# Patient Record
Sex: Male | Born: 1965
Health system: Southern US, Community
[De-identification: ages and names within clinical notes are randomized; demographics above are authoritative.]

## PROBLEM LIST (undated history)

## (undated) DIAGNOSIS — M549 Dorsalgia, unspecified: Secondary | ICD-10-CM

## (undated) DIAGNOSIS — W3400XA Accidental discharge from unspecified firearms or gun, initial encounter: Secondary | ICD-10-CM

## (undated) DIAGNOSIS — H5440 Blindness, one eye, unspecified eye: Secondary | ICD-10-CM

## (undated) DIAGNOSIS — E785 Hyperlipidemia, unspecified: Secondary | ICD-10-CM

## (undated) DIAGNOSIS — I10 Essential (primary) hypertension: Secondary | ICD-10-CM

## (undated) HISTORY — PX: EYE SURGERY: SHX253

---

## 2001-12-31 ENCOUNTER — Encounter: Payer: Self-pay | Admitting: Emergency Medicine

## 2001-12-31 ENCOUNTER — Emergency Department (HOSPITAL_COMMUNITY): Admission: EM | Admit: 2001-12-31 | Discharge: 2001-12-31 | Payer: Self-pay | Admitting: Emergency Medicine

## 2013-05-22 ENCOUNTER — Emergency Department (HOSPITAL_COMMUNITY)
Admission: EM | Admit: 2013-05-22 | Discharge: 2013-05-22 | Disposition: A | Discharge status: Home / Self Care | Payer: Self-pay | Attending: Emergency Medicine | Admitting: Emergency Medicine

## 2013-05-22 ENCOUNTER — Encounter (HOSPITAL_COMMUNITY): Payer: Self-pay | Admitting: *Deleted

## 2013-05-22 DIAGNOSIS — E785 Hyperlipidemia, unspecified: Secondary | ICD-10-CM | POA: Insufficient documentation

## 2013-05-22 DIAGNOSIS — M545 Low back pain, unspecified: Secondary | ICD-10-CM | POA: Insufficient documentation

## 2013-05-22 DIAGNOSIS — Z79899 Other long term (current) drug therapy: Secondary | ICD-10-CM | POA: Insufficient documentation

## 2013-05-22 DIAGNOSIS — G8929 Other chronic pain: Secondary | ICD-10-CM | POA: Insufficient documentation

## 2013-05-22 DIAGNOSIS — M25559 Pain in unspecified hip: Secondary | ICD-10-CM | POA: Insufficient documentation

## 2013-05-22 DIAGNOSIS — M62838 Other muscle spasm: Secondary | ICD-10-CM | POA: Insufficient documentation

## 2013-05-22 DIAGNOSIS — I10 Essential (primary) hypertension: Secondary | ICD-10-CM | POA: Insufficient documentation

## 2013-05-22 DIAGNOSIS — F17200 Nicotine dependence, unspecified, uncomplicated: Secondary | ICD-10-CM | POA: Insufficient documentation

## 2013-05-22 HISTORY — DX: Essential (primary) hypertension: I10

## 2013-05-22 HISTORY — DX: Dorsalgia, unspecified: M54.9

## 2013-05-22 HISTORY — DX: Hyperlipidemia, unspecified: E78.5

## 2013-05-22 MED ORDER — CYCLOBENZAPRINE HCL 5 MG PO TABS: 5.0000 mg | ORAL_TABLET | Freq: Three times a day (TID) | ORAL | Status: DC | PRN

## 2013-05-22 MED ORDER — TRAMADOL HCL 50 MG PO TABS: 50.0000 mg | ORAL_TABLET | Freq: Four times a day (QID) | ORAL | Status: DC | PRN

## 2013-05-22 MED ORDER — CYCLOBENZAPRINE HCL 10 MG PO TABS
5.0000 mg | ORAL_TABLET | Freq: Once | ORAL | Status: AC
  Administered 2013-05-22: 5 mg via ORAL
  Filled 2013-05-22: qty 1

## 2013-05-22 MED ORDER — KETOROLAC TROMETHAMINE 30 MG/ML IJ SOLN
30.0000 mg | Freq: Once | INTRAMUSCULAR | Status: AC
  Administered 2013-05-22: 30 mg via INTRAMUSCULAR
  Filled 2013-05-22: qty 1

## 2013-05-22 NOTE — ED Notes (Signed)
Pt states he has had hip and back pain for years and it is hurting today more, no known injury

## 2013-05-22 NOTE — ED Provider Notes (Signed)
Medical screening examination/treatment/procedure(s) were performed by non-physician practitioner and as supervising physician I was immediately available for consultation/collaboration.  Wymon Swaney, MD 05/22/13 0844 
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
## 2013-06-03 ENCOUNTER — Ambulatory Visit: Payer: Self-pay | Attending: Family Medicine | Admitting: Internal Medicine

## 2013-06-03 ENCOUNTER — Encounter: Payer: Self-pay | Admitting: Internal Medicine

## 2013-06-03 VITALS — BP 147/90 | HR 92 | Temp 97.9°F | Resp 17 | Wt 221.6 lb

## 2013-06-03 DIAGNOSIS — M545 Low back pain, unspecified: Secondary | ICD-10-CM

## 2013-06-03 DIAGNOSIS — I10 Essential (primary) hypertension: Secondary | ICD-10-CM

## 2013-06-03 DIAGNOSIS — F17200 Nicotine dependence, unspecified, uncomplicated: Secondary | ICD-10-CM

## 2013-06-03 LAB — CBC WITH DIFFERENTIAL/PLATELET
Basophils Absolute: 0 10*3/uL (ref 0.0–0.1)
Eosinophils Relative: 2 % (ref 0–5)
HCT: 44.8 % (ref 39.0–52.0)
Hemoglobin: 15.3 g/dL (ref 13.0–17.0)
Lymphocytes Relative: 32 % (ref 12–46)
Lymphs Abs: 2.6 10*3/uL (ref 0.7–4.0)
MCV: 91.4 fL (ref 78.0–100.0)
Monocytes Absolute: 0.8 10*3/uL (ref 0.1–1.0)
Neutro Abs: 4.4 10*3/uL (ref 1.7–7.7)
RBC: 4.9 MIL/uL (ref 4.22–5.81)
RDW: 12.7 % (ref 11.5–15.5)
WBC: 8 10*3/uL (ref 4.0–10.5)

## 2013-06-03 LAB — COMPLETE METABOLIC PANEL WITH GFR
AST: 32 U/L (ref 0–37)
Alkaline Phosphatase: 66 U/L (ref 39–117)
BUN: 17 mg/dL (ref 6–23)
Creat: 1.05 mg/dL (ref 0.50–1.35)
GFR, Est Non African American: 84 mL/min
Glucose, Bld: 94 mg/dL (ref 70–99)
Potassium: 4.1 mEq/L (ref 3.5–5.3)
Total Bilirubin: 1 mg/dL (ref 0.3–1.2)

## 2013-06-03 LAB — LIPID PANEL
HDL: 40 mg/dL (ref >39)
LDL Cholesterol: 147 mg/dL — ABNORMAL HIGH (ref 0–99)
Total CHOL/HDL Ratio: 5.3 Ratio
Triglycerides: 118 mg/dL (ref <150)

## 2013-06-03 MED ORDER — CYCLOBENZAPRINE HCL 5 MG PO TABS: 5.0000 mg | ORAL_TABLET | Freq: Three times a day (TID) | ORAL | Status: DC | PRN

## 2013-06-03 MED ORDER — TRAMADOL HCL 50 MG PO TABS: 50.0000 mg | ORAL_TABLET | Freq: Four times a day (QID) | ORAL | Status: DC | PRN

## 2013-06-03 MED ORDER — ENALAPRIL MALEATE 2.5 MG PO TABS: 2.5000 mg | ORAL_TABLET | Freq: Every morning | ORAL | Status: DC

## 2013-06-03 MED ORDER — LOVASTATIN 20 MG PO TABS: 20.0000 mg | ORAL_TABLET | Freq: Every morning | ORAL | Status: DC

## 2013-06-03 MED ORDER — HYDROCHLOROTHIAZIDE 25 MG PO TABS: 25.0000 mg | ORAL_TABLET | Freq: Every morning | ORAL | Status: DC

## 2013-06-03 NOTE — Progress Notes (Signed)
Patient is here to establish care Has pain in his back and right hip Recently seen in the ED

## 2013-06-03 NOTE — Progress Notes (Signed)
Patient ID: Gregory Gay, male   DOB: 1966/04/09, 47 y.o.   MRN: [id number]  CC: To establish care  HPI: Patient was in the clinic today to establish medical care. He is a 47 years old African American, recently seen in the ED for back pain and groin pain on the right. He describes it as more like spasm. No injury. Patient is known to have hypertension and hyperlipidemia, has been on medications as listed below. He does not have diabetes. He smokes cigarettes about half a pack per day, and he drinks alcohol but occasionally. He claims to be physically active in the past but now because of pain he is no more active. Denies chest, no headache, no abdominal pain,   No Known Allergies Past Medical History  Diagnosis Date  . Back pain   . Hypertension   . Hyperlipidemia    No current outpatient prescriptions on file prior to visit.   No current facility-administered medications on file prior to visit.   No family history on file. History   Social History  . Marital Status: Single    Spouse Name: N/A    Number of Children: N/A  . Years of Education: N/A   Occupational History  . Not on file.   Social History Main Topics  . Smoking status: Heavy Tobacco Smoker -- 0.50 packs/day  . Smokeless tobacco: Not on file  . Alcohol Use: No  . Drug Use: No  . Sexual Activity: Not on file   Other Topics Concern  . Not on file   Social History Narrative  . No narrative on file    Review of Systems: Constitutional: Negative for fever, chills, diaphoresis, activity change, appetite change and fatigue. HENT: Negative for ear pain, nosebleeds, congestion, facial swelling, rhinorrhea, neck pain, neck stiffness and ear discharge.  Eyes: Negative for pain, discharge, redness, itching and visual disturbance. Respiratory: Negative for cough, choking, chest tightness, shortness of breath, wheezing and stridor.  Cardiovascular: Negative for chest pain, palpitations and leg  swelling. Gastrointestinal: Negative for abdominal distention. Genitourinary: Negative for dysuria, urgency, frequency, hematuria, flank pain, decreased urine volume, difficulty urinating and dyspareunia.  Musculoskeletal: ++ back pain, joint swelling, arthralgias and gait problem. Neurological: Negative for dizziness, tremors, seizures, syncope, facial asymmetry, speech difficulty, weakness, light-headedness, numbness and headaches.  Hematological: Negative for adenopathy. Does not bruise/bleed easily. Psychiatric/Behavioral: Negative for hallucinations, behavioral problems, confusion, dysphoric mood, decreased concentration and agitation.    Objective:   Filed Vitals:   06/03/13 1458  BP: 147/90  Pulse: 92  Temp: 97.9 F (36.6 C)  Resp: 17    Physical Exam: Constitutional: Patient appears well-developed and well-nourished. No distress. obese HENT: Normocephalic, atraumatic, External right and left ear normal. Oropharynx is clear and moist.  Eyes: Conjunctivae and EOM are normal. PERRLA, no scleral icterus. Neck: Normal ROM. Neck supple. No JVD. No tracheal deviation. No thyromegaly. CVS: RRR, S1/S2 +, no murmurs, no gallops, no carotid bruit.  Pulmonary: Effort and breath sounds normal, no stridor, rhonchi, wheezes, rales.  Abdominal: Soft. BS +,  no distension, tenderness, rebound or guarding.  Musculoskeletal: Normal range of motion. No edema and no tenderness.  Lymphadenopathy: No lymphadenopathy noted, cervical, inguinal or axillary Neuro: Alert. Normal reflexes, muscle tone coordination. No cranial nerve deficit. Skin: Skin is warm and dry. No rash noted. Not diaphoretic. No erythema. No pallor. Psychiatric: Normal mood and affect. Behavior, judgment, thought content normal.  No results found for this basename: WBC, HGB, HCT, MCV, PLT   No  results found for this basename: CREATININE, BUN, NA, K, CL, CO2    No results found for this basename: HGBA1C   Lipid Panel  No  results found for this basename: chol, trig, hdl, cholhdl, vldl, ldlcalc       Assessment and plan:   Patient Active Problem List   Diagnosis Date Noted  . Essential hypertension, benign 06/03/2013  . Low back pain 06/03/2013  . Nicotine dependence 06/03/2013   Following medications were prescribed:  Hydrochlorothiazide 25 mg tablet by mouth daily Enalapril 2.5 mg tablet by mouth daily Lovastatin 20 mg tablet by mouth daily Cyclobenzaprine 5 mg tablet by mouth 3 times a day Tramadol 50 mg tablet by mouth every 6 hour when necessary  Lab: Comprehensive metabolic panel CBC D. Lipid panel Complete urinalysis  Patient extensively counseled about pain Extensive counseling on smoking cessation carried out Patient was counseled about blood pressure control and the need for medication compliance Patient was also counseled on nutrition and exercise  Mayan Sweeden was given clear instructions to go to ER or return to the clinic if symptoms don't improve, worsen or new problems develop.  Andris Ahr verbalized understanding.  Kamarrion Beevers was told to call to get lab results if hasn't heard anything in the next week.         Jeanann Lewandowsky, MD [facility] [place], [place] [phone]   06/03/2013, 4:16 PM

## 2013-06-04 LAB — URINALYSIS, COMPLETE
Bacteria, UA: NONE SEEN
Casts: NONE SEEN
Glucose, UA: NEGATIVE mg/dL
Hgb urine dipstick: NEGATIVE
Ketones, ur: NEGATIVE mg/dL
Nitrite: NEGATIVE
Protein, ur: NEGATIVE mg/dL
pH: 5.5 (ref 5.0–8.0)

## 2013-06-10 ENCOUNTER — Ambulatory Visit: Payer: Self-pay | Attending: Family Medicine

## 2013-06-17 ENCOUNTER — Ambulatory Visit: Payer: PRIVATE HEALTH INSURANCE | Attending: Internal Medicine

## 2013-06-17 DIAGNOSIS — IMO0001 Reserved for inherently not codable concepts without codable children: Secondary | ICD-10-CM | POA: Insufficient documentation

## 2013-06-17 DIAGNOSIS — M2550 Pain in unspecified joint: Secondary | ICD-10-CM | POA: Insufficient documentation

## 2013-06-17 DIAGNOSIS — R293 Abnormal posture: Secondary | ICD-10-CM | POA: Insufficient documentation

## 2013-06-27 ENCOUNTER — Ambulatory Visit: Payer: PRIVATE HEALTH INSURANCE | Admitting: Physical Therapy

## 2013-07-02 ENCOUNTER — Other Ambulatory Visit: Payer: Self-pay | Admitting: Internal Medicine

## 2013-07-04 ENCOUNTER — Encounter: Payer: Self-pay | Admitting: Internal Medicine

## 2013-07-04 ENCOUNTER — Ambulatory Visit: Payer: PRIVATE HEALTH INSURANCE

## 2013-07-04 ENCOUNTER — Ambulatory Visit: Payer: PRIVATE HEALTH INSURANCE | Attending: Internal Medicine | Admitting: Internal Medicine

## 2013-07-04 VITALS — BP 127/71 | HR 72 | Temp 97.9°F | Resp 18 | Wt 220.0 lb

## 2013-07-04 DIAGNOSIS — E785 Hyperlipidemia, unspecified: Secondary | ICD-10-CM | POA: Insufficient documentation

## 2013-07-04 DIAGNOSIS — M545 Low back pain, unspecified: Secondary | ICD-10-CM

## 2013-07-04 DIAGNOSIS — I10 Essential (primary) hypertension: Secondary | ICD-10-CM

## 2013-07-04 MED ORDER — CYCLOBENZAPRINE HCL 5 MG PO TABS: 5.0000 mg | ORAL_TABLET | Freq: Three times a day (TID) | ORAL | Status: DC | PRN

## 2013-07-04 MED ORDER — HYDROCHLOROTHIAZIDE 25 MG PO TABS: 25.0000 mg | ORAL_TABLET | Freq: Every morning | ORAL | Status: DC

## 2013-07-04 MED ORDER — TRAMADOL HCL 50 MG PO TABS: 50.0000 mg | ORAL_TABLET | Freq: Four times a day (QID) | ORAL | Status: DC | PRN

## 2013-07-04 MED ORDER — LOVASTATIN 20 MG PO TABS: 20.0000 mg | ORAL_TABLET | Freq: Every morning | ORAL | Status: DC

## 2013-07-04 MED ORDER — ENALAPRIL MALEATE 2.5 MG PO TABS: 2.5000 mg | ORAL_TABLET | Freq: Every morning | ORAL | Status: DC

## 2013-07-04 NOTE — Progress Notes (Signed)
Pt is here for a f/u and needing results on recent labs Also needing refills on all meds He is alert w/no signs of acute distress.

## 2013-07-04 NOTE — Telephone Encounter (Signed)
Medication refill-flexeril

## 2013-07-04 NOTE — Progress Notes (Signed)
Patient ID: Gregory Gay, male   DOB: October 20, 1965, 47 y.o.   MRN: [id number]  CC: Refill on medications  HPI: Patient is 47 year old male with hypertension and hyperlipidemia who presents to clinic for followup and needs refills on medicines. He denies chest pain or shortness of breath, no specific abdominal or urinary concerns, no chest pain or shortness of breath. Reports compliance with medical therapy and check blood pressure regularly.  No Known Allergies Past Medical History  Diagnosis Date  . Back pain   . Hypertension   . Hyperlipidemia    No current outpatient prescriptions on file prior to visit.   No current facility-administered medications on file prior to visit.    Family history hypertension History   Social History  . Marital Status: Single    Spouse Name: N/A    Number of Children: N/A  . Years of Education: N/A   Occupational History  . Not on file.   Social History Main Topics  . Smoking status: Heavy Tobacco Smoker -- 0.50 packs/day  . Smokeless tobacco: Not on file  . Alcohol Use: No  . Drug Use: No  . Sexual Activity: Not on file   Other Topics Concern  . Not on file   Social History Narrative  . No narrative on file    Review of Systems  Constitutional: Negative for fever, chills, diaphoresis, activity change, appetite change and fatigue.  HENT: Negative for ear pain, nosebleeds, congestion, facial swelling, rhinorrhea, neck pain, neck stiffness and ear discharge.   Eyes: Negative for pain, discharge, redness, itching and visual disturbance.  Respiratory: Negative for cough, choking, chest tightness, shortness of breath, wheezing and stridor.   Cardiovascular: Negative for chest pain, palpitations and leg swelling.  Gastrointestinal: Negative for abdominal distention.  Genitourinary: Negative for dysuria, urgency, frequency, hematuria, flank pain, decreased urine volume, difficulty urinating and dyspareunia.  Musculoskeletal: Negative for back  pain, joint swelling, arthralgias and gait problem.  Neurological: Negative for dizziness, tremors, seizures, syncope, facial asymmetry, speech difficulty, weakness, light-headedness, numbness and headaches.  Hematological: Negative for adenopathy. Does not bruise/bleed easily.  Psychiatric/Behavioral: Negative for hallucinations, behavioral problems, confusion, dysphoric mood, decreased concentration and agitation.    Objective:   Filed Vitals:   07/04/13 1439  BP: 127/71  Pulse: 72  Temp: 97.9 F (36.6 C)  Resp: 18    Physical Exam  Constitutional: Appears well-developed and well-nourished. No distress.  Neck: Normal ROM. Neck supple. No JVD. No tracheal deviation. No thyromegaly.  CVS: RRR, S1/S2 +, no murmurs, no gallops, no carotid bruit.  Pulmonary: Effort and breath sounds normal, no stridor, rhonchi, wheezes, rales.  Abdominal: Soft. BS +,  no distension, tenderness, rebound or guarding.   Lab Results  Component Value Date   WBC 8.0 06/03/2013   HGB 15.3 06/03/2013   HCT 44.8 06/03/2013   MCV 91.4 06/03/2013   PLT 235 06/03/2013   Lab Results  Component Value Date   CREATININE 1.05 06/03/2013   BUN 17 06/03/2013   NA 137 06/03/2013   K 4.1 06/03/2013   CL 102 06/03/2013   CO2 27 06/03/2013    No results found for this basename: HGBA1C   Lipid Panel     Component Value Date/Time   CHOL 211* 06/03/2013 1535   TRIG 118 06/03/2013 1535   HDL 40 06/03/2013 1535   CHOLHDL 5.3 06/03/2013 1535   VLDL 24 06/03/2013 1535   LDLCALC 147* 06/03/2013 1535       Assessment and plan:  Patient Active Problem List   Diagnosis Date Noted  . Essential hypertension, benign - appears to be well controlled, we'll provide refills on medications, advised continuation of regular blood pressure check  06/03/2013

## 2013-07-04 NOTE — Patient Instructions (Signed)

## 2013-07-07 ENCOUNTER — Other Ambulatory Visit: Payer: Self-pay | Admitting: Emergency Medicine

## 2013-07-07 MED ORDER — CYCLOBENZAPRINE HCL 5 MG PO TABS: 5.0000 mg | ORAL_TABLET | Freq: Three times a day (TID) | ORAL | Status: DC | PRN

## 2013-07-18 ENCOUNTER — Ambulatory Visit: Payer: PRIVATE HEALTH INSURANCE

## 2013-07-25 ENCOUNTER — Ambulatory Visit: Payer: PRIVATE HEALTH INSURANCE | Attending: Internal Medicine

## 2013-07-25 DIAGNOSIS — M2550 Pain in unspecified joint: Secondary | ICD-10-CM | POA: Insufficient documentation

## 2013-07-25 DIAGNOSIS — IMO0001 Reserved for inherently not codable concepts without codable children: Secondary | ICD-10-CM | POA: Insufficient documentation

## 2013-07-25 DIAGNOSIS — R293 Abnormal posture: Secondary | ICD-10-CM | POA: Insufficient documentation

## 2013-08-08 ENCOUNTER — Ambulatory Visit: Payer: PRIVATE HEALTH INSURANCE

## 2013-09-30 ENCOUNTER — Ambulatory Visit: Payer: PRIVATE HEALTH INSURANCE | Attending: Internal Medicine

## 2014-02-27 ENCOUNTER — Encounter: Payer: Self-pay | Admitting: Internal Medicine

## 2014-02-27 ENCOUNTER — Ambulatory Visit: Payer: No Typology Code available for payment source | Attending: Internal Medicine | Admitting: Internal Medicine

## 2014-02-27 VITALS — BP 148/91 | HR 88 | Temp 98.1°F | Resp 16 | Ht 67.0 in | Wt 216.0 lb

## 2014-02-27 DIAGNOSIS — G4733 Obstructive sleep apnea (adult) (pediatric): Secondary | ICD-10-CM | POA: Insufficient documentation

## 2014-02-27 DIAGNOSIS — M25559 Pain in unspecified hip: Secondary | ICD-10-CM | POA: Insufficient documentation

## 2014-02-27 DIAGNOSIS — E785 Hyperlipidemia, unspecified: Secondary | ICD-10-CM | POA: Insufficient documentation

## 2014-02-27 DIAGNOSIS — F17200 Nicotine dependence, unspecified, uncomplicated: Secondary | ICD-10-CM | POA: Insufficient documentation

## 2014-02-27 DIAGNOSIS — Z79899 Other long term (current) drug therapy: Secondary | ICD-10-CM | POA: Insufficient documentation

## 2014-02-27 DIAGNOSIS — M545 Low back pain, unspecified: Secondary | ICD-10-CM | POA: Insufficient documentation

## 2014-02-27 DIAGNOSIS — I10 Essential (primary) hypertension: Secondary | ICD-10-CM | POA: Insufficient documentation

## 2014-02-27 LAB — COMPLETE METABOLIC PANEL WITHOUT GFR
ALT: 25 U/L (ref 0–53)
AST: 24 U/L (ref 0–37)
Albumin: 4.2 g/dL (ref 3.5–5.2)
Alkaline Phosphatase: 63 U/L (ref 39–117)
BUN: 12 mg/dL (ref 6–23)
CO2: 28 meq/L (ref 19–32)
Calcium: 9.5 mg/dL (ref 8.4–10.5)
Chloride: 104 meq/L (ref 96–112)
Creat: 0.9 mg/dL (ref 0.50–1.35)
GFR, Est African American: >89 mL/min
GFR, Est Non African American: >89 mL/min
Glucose, Bld: 85 mg/dL (ref 70–99)
Potassium: 4.1 meq/L (ref 3.5–5.3)
Sodium: 140 meq/L (ref 135–145)
Total Bilirubin: 1.1 mg/dL (ref 0.2–1.2)
Total Protein: 6.3 g/dL (ref 6.0–8.3)

## 2014-02-27 LAB — LIPID PANEL
Cholesterol: 188 mg/dL (ref 0–200)
HDL: 45 mg/dL
LDL Cholesterol: 124 mg/dL — ABNORMAL HIGH (ref 0–99)
Total CHOL/HDL Ratio: 4.2 ratio
Triglycerides: 97 mg/dL
VLDL: 19 mg/dL (ref 0–40)

## 2014-02-27 MED ORDER — IBUPROFEN 800 MG PO TABS: 800.0000 mg | ORAL_TABLET | Freq: Three times a day (TID) | ORAL | Status: DC | PRN

## 2014-02-27 MED ORDER — LOVASTATIN 20 MG PO TABS: 20.0000 mg | ORAL_TABLET | Freq: Every morning | ORAL | Status: DC

## 2014-02-27 MED ORDER — CYCLOBENZAPRINE HCL 5 MG PO TABS
5.0000 mg | ORAL_TABLET | Freq: Three times a day (TID) | ORAL | Status: DC | PRN
Start: 2014-02-27 — End: 2015-11-22

## 2014-02-27 MED ORDER — HYDROCHLOROTHIAZIDE 25 MG PO TABS: 25.0000 mg | ORAL_TABLET | Freq: Every morning | ORAL | Status: DC

## 2014-02-27 MED ORDER — ENALAPRIL MALEATE 2.5 MG PO TABS: 2.5000 mg | ORAL_TABLET | Freq: Every morning | ORAL | Status: DC

## 2014-02-27 NOTE — Progress Notes (Signed)
Pt is here following up on his pain in his right hip and the right side of his lower back
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
## 2014-02-28 ENCOUNTER — Ambulatory Visit: Payer: Self-pay | Admitting: Internal Medicine

## 2014-02-28 LAB — TSH: TSH: 0.642 u[IU]/mL (ref 0.350–4.500)

## 2014-03-01 ENCOUNTER — Ambulatory Visit: Payer: Self-pay | Admitting: Internal Medicine

## 2014-03-02 ENCOUNTER — Telehealth: Payer: Self-pay | Admitting: *Deleted

## 2014-03-02 NOTE — Telephone Encounter (Signed)
Message copied by Raynelle CharyWINFREE, Ladarrell Cornwall R on Thu Mar 02, 2014  5:41 PM ------      Message from: Jeanann LewandowskyJEGEDE, OLUGBEMIGA E      Created: Wed Mar 01, 2014 10:25 AM       Please inform patient that his laboratory tests results are mostly within normal limit. His cholesterol is slightly high but better than previous results, I will advise regular physical exercise and to continue the cholesterol medication ------

## 2014-03-02 NOTE — Telephone Encounter (Signed)
Left message 1st attempt.  

## 2014-03-16 ENCOUNTER — Ambulatory Visit (HOSPITAL_BASED_OUTPATIENT_CLINIC_OR_DEPARTMENT_OTHER): Payer: No Typology Code available for payment source | Attending: Internal Medicine | Admitting: Radiology

## 2014-03-16 VITALS — Ht 67.0 in | Wt 210.0 lb

## 2014-03-16 DIAGNOSIS — R0989 Other specified symptoms and signs involving the circulatory and respiratory systems: Secondary | ICD-10-CM | POA: Insufficient documentation

## 2014-03-16 DIAGNOSIS — G4730 Sleep apnea, unspecified: Principal | ICD-10-CM

## 2014-03-16 DIAGNOSIS — G4733 Obstructive sleep apnea (adult) (pediatric): Secondary | ICD-10-CM

## 2014-03-16 DIAGNOSIS — G4710 Hypersomnia, unspecified: Secondary | ICD-10-CM | POA: Insufficient documentation

## 2014-03-16 DIAGNOSIS — R0609 Other forms of dyspnea: Secondary | ICD-10-CM | POA: Insufficient documentation

## 2014-03-19 DIAGNOSIS — G4710 Hypersomnia, unspecified: Secondary | ICD-10-CM

## 2014-03-19 DIAGNOSIS — G4733 Obstructive sleep apnea (adult) (pediatric): Secondary | ICD-10-CM

## 2014-03-19 DIAGNOSIS — G4730 Sleep apnea, unspecified: Secondary | ICD-10-CM

## 2014-03-19 NOTE — Sleep Study (Signed)
   NAME: Gregory Gay DATE OF BIRTH:  [birth year] MEDICAL RECORD NUMBER [id number]  LOCATION: [facility]  PHYSICIAN: Hollan Philipp D Lilyona Richner  DATE OF STUDY: 03/16/2014  SLEEP STUDY TYPE: Nocturnal Polysomnogram               REFERRING PHYSICIAN: Jeanann Lewandowsky, MD  INDICATION FOR STUDY: Hypersomnia with sleep apnea  EPWORTH SLEEPINESS SCORE:   21/24  HEIGHT: 5\' 7"  (170.2 cm)  WEIGHT: 95.255 kg (210 lb)    Body mass index is 32.88 kg/(m^2).  NECK SIZE: 16 in.  MEDICATIONS: Charted for review  SLEEP ARCHITECTURE: Split study protocol. During the diagnostic phase, total sleep time 124.5 minutes with sleep efficiency 88%. Stage I was 10.8%, stage II 70.3%, stage III absent, REM 18.9% of total sleep time. Sleep latency 101.5 minutes, awake after sleep onset 7.5 minutes, arousal index 56.4, bedtime medication: None  RESPIRATORY DATA: Apnea hypopneas index (AHI) 80 per hour. 166 total events scored including 126 obstructive apneas, 2 mixed apneas, 38 hypopneas. Events were not positional. REM AHI 107.2 per hour. CPAP titration to 19 CWP, AHI 47.1 per hour. To provide higher pressures, he was switched to bilevel and titrated to a final inspiratory pressure of 23 and expiratory pressure of 18 CWP, AHI 0 per hour. He wore a medium simplicity fullface mask with Easy Breathe.  OXYGEN DATA: Very loud snoring before CPAP with oxygen desaturation to a nadir of 59% on room air. At final Bilevel control snoring was prevented and mean oxygen saturation of 93.8% on room air.  CARDIAC DATA: Sinus rhythm with frequent PACs  MOVEMENT/PARASOMNIA: No significant movement disturbance, bathroom x1  IMPRESSION/ RECOMMENDATION:   1) Severe obstructive sleep apnea/hypopneas syndrome, AHI 80 per hour with non-positional events. Loud snoring with oxygen desaturation to a nadir of 59% on room air.  2) CPAP was titrated to 19 CWP with inadequate control. To provide higher pressures, he was  changed to bilevel and titrated successfully to final pressures inspiratory 23 and expiratory 18 CWP, AHI 0 per hour.  He wore a medium [employer] Simplus fullface mask with Easy Breathe support. Snoring was prevented and mean oxygen saturation health 93.8% on room air.  Signed Jetty Duhamel M.D. Waymon Budge Diplomate, Biomedical engineer of Sleep Medicine  ELECTRONICALLY SIGNED ON:  03/19/2014, 4:22 PM [facility] PH: [phone]   FX: [phone] ACCREDITED BY THE AMERICAN ACADEMY OF SLEEP MEDICINE

## 2014-04-10 ENCOUNTER — Encounter (HOSPITAL_BASED_OUTPATIENT_CLINIC_OR_DEPARTMENT_OTHER): Payer: No Typology Code available for payment source

## 2014-04-19 ENCOUNTER — Ambulatory Visit (INDEPENDENT_AMBULATORY_CARE_PROVIDER_SITE_OTHER)
Admission: RE | Admit: 2014-04-19 | Discharge: 2014-04-19 | Disposition: A | Discharge status: Home / Self Care | Payer: Self-pay | Source: Ambulatory Visit | Attending: Internal Medicine | Admitting: Internal Medicine

## 2014-04-19 ENCOUNTER — Ambulatory Visit (INDEPENDENT_AMBULATORY_CARE_PROVIDER_SITE_OTHER): Payer: Self-pay | Admitting: Internal Medicine

## 2014-04-19 ENCOUNTER — Encounter: Payer: Self-pay | Admitting: Internal Medicine

## 2014-04-19 VITALS — BP 128/80 | HR 90 | Temp 98.5°F | Ht 67.0 in | Wt 219.0 lb

## 2014-04-19 DIAGNOSIS — R0609 Other forms of dyspnea: Secondary | ICD-10-CM

## 2014-04-19 DIAGNOSIS — R0600 Dyspnea, unspecified: Secondary | ICD-10-CM

## 2014-04-19 DIAGNOSIS — R0989 Other specified symptoms and signs involving the circulatory and respiratory systems: Secondary | ICD-10-CM

## 2014-04-19 DIAGNOSIS — I10 Essential (primary) hypertension: Secondary | ICD-10-CM

## 2014-04-19 DIAGNOSIS — F17200 Nicotine dependence, unspecified, uncomplicated: Secondary | ICD-10-CM

## 2014-04-19 DIAGNOSIS — G4733 Obstructive sleep apnea (adult) (pediatric): Secondary | ICD-10-CM

## 2014-04-19 MED ORDER — VALSARTAN 80 MG PO TABS: 80.0000 mg | ORAL_TABLET | Freq: Every day | ORAL | Status: DC

## 2014-04-19 NOTE — Patient Instructions (Signed)
Please see patient coordinator before you leave today  to schedule bilevel and follow up with Dr Maple HudsonYoung in one month  Stop vasotec (enalapril)  Start diovan (valsartan) 80 mg daily and your breathing should improve  The key is to stop smoking completely before smoking completely stops you!

## 2014-04-19 NOTE — Assessment & Plan Note (Addendum)

## 2014-04-19 NOTE — Progress Notes (Signed)
   Subjective:    Patient ID: Gregory Gay, male    DOB: [birth year]  MRN: [id number]  HPI  [id number] yobm smoker referred to pulmonary clinic 04/19/2014 for sob and osa  04/19/2014 1st [facility] office visit/ Akiva Brassfield   Chief Complaint  Patient presents with  . Pulmonary Consult    Self referral for "sleep apnea". Pt reports dxed with OSA 1 wk ago. He is not using CPAP. He states having SOB "for a while now".  He states that he gets SOB with walking approx 100 ft.   excessive fatigue and daytime drowsiness x sev years> split night study done 03/17/14 >> bilevel rec by Dr Maple HudsonYoung not yet started   Variable sob x sev years - sometimes can play BB and sometimes sob x 50 ft  No obvious  patterns in day to day or daytime variabilty or assoc chronic cough or cp or chest tightness, subjective wheeze overt sinus or hb symptoms. No unusual exp hx or h/o childhood pna/ asthma or knowledge of premature birth.  Sleeping ok without nocturnal  or early am exacerbation  of respiratory  c/o's or need for noct saba. Also denies any obvious fluctuation of symptoms with weather or environmental changes or other aggravating or alleviating factors except as outlined above   Current Medications, Allergies, Complete Past Medical History, Past Surgical History, Family History, and Social History were reviewed in [facility] electronic medical record.            Review of Systems  Constitutional: Negative for fever, chills, activity change, appetite change and unexpected weight change.  HENT: Negative for congestion, dental problem, postnasal drip, rhinorrhea, sneezing, sore throat, trouble swallowing and voice change.   Eyes: Negative for visual disturbance.  Respiratory: Positive for shortness of breath. Negative for cough and choking.   Cardiovascular: Negative for chest pain and leg swelling.  Gastrointestinal: Negative for nausea, vomiting and abdominal pain.  Genitourinary: Negative for difficulty  urinating.  Musculoskeletal: Positive for arthralgias.  Skin: Negative for rash.  Psychiatric/Behavioral: Negative for behavioral problems and confusion.       Objective:   Physical Exam  Amb hoarse bm nad  Wt Readings from Last 3 Encounters:  04/19/14 219 lb (99.338 kg)  03/16/14 210 lb (95.255 kg)  02/27/14 216 lb (97.977 kg)      HEENT: nl dentition, turbinates, and orophanx. Nl external ear canals without cough reflex   NECK :  without JVD/Nodes/TM/ nl carotid upstrokes bilaterally   LUNGS: no acc muscle use, clear to A and P bilaterally without cough on insp or exp maneuvers   CV:  RRR  no s3 or murmur or increase in P2, no edema   ABD:  soft and nontender with nl excursion in the supine position. No bruits or organomegaly, bowel sounds nl  MS:  warm without deformities, calf tenderness, cyanosis or clubbing  SKIN: warm and dry without lesions    NEURO:  alert, approp, no deficits    CXR  04/19/2014 :   The heart size and mediastinal contours are within normal limits. Both lungs are clear. The visualized skeletal structures are unremarkable.      Assessment & Plan:

## 2014-04-19 NOTE — Assessment & Plan Note (Signed)
-   04/19/2014  Walked RA x 3 laps @ 185 ft each stopped due to end of study no desat - spirometry 04/19/2014 wnl    Symptoms are markedly disproportionate to objective findings and not clear this is a lung problem but pt does appear to have difficult airway management issues.   DDX of  difficult airways management all start with A and  include Adherence, Ace Inhibitors, Acid Reflux, Active Sinus Disease, Alpha 1 Antitripsin deficiency, Anxiety masquerading as Airways dz,  ABPA,  allergy(esp in young), Aspiration (esp in elderly), Adverse effects of DPI,  Active smokers, plus two Bs  = Bronchiectasis and Beta blocker use..and one C= CHF  acei top of the list > try off, see hbp  Active smoking also a concern, discussed sep see smoking

## 2014-04-19 NOTE — Assessment & Plan Note (Signed)
ACE inhibitors are problematic in  pts with airway complaints because  even experienced pulmonologists can't always distinguish ace effects from copd/asthma.  By themselves they don't actually cause a problem, much like oxygen can't by itself start a fire, but they certainly serve as a powerful catalyst or enhancer for any "fire"  or inflammatory process in the upper airway, be it caused by an ET  tube or more commonly reflux (especially in the obese or pts with known GERD or who are on biphoshonates).    In the era of ARB near equivalency until we have a better handle on the reversibility of the airway problem, it just makes sense to avoid ACEI  entirely in the short run and then decide later, having established a level of airway control using a reasonable limited regimen, whether to add back ace but even then being very careful to observe the pt for worsening airway control and number of meds used/ needed to control symptoms.    Try off vasotec > on diovan and f/u Dr Hyman HopesJegede

## 2014-04-19 NOTE — Assessment & Plan Note (Signed)
See sleep study 03/17/14 > set up bilevel 04/19/2014 > f/u Dr Maple HudsonYoung

## 2014-04-20 NOTE — Progress Notes (Signed)
Quick Note:  Called pt - went directly to VM. [email or web address] ______

## 2014-04-24 ENCOUNTER — Telehealth: Payer: Self-pay | Admitting: Internal Medicine

## 2014-04-24 NOTE — Progress Notes (Signed)
Quick Note:  LMTCB ______ 

## 2014-04-24 NOTE — Telephone Encounter (Signed)
[facility] calling with FYI for our office  FYI for Dr Wert/PCC: BiPAP was ordered 04/19/14 Pt has decided to go through asaa because he has a high deductible (ASAA program : CPAP assistance program)  Per Bjorn Loserhonda, patient does qualify for this program d/t him having insurance  Will be sent to [place] per her request as she is going to speak with [facility].

## 2014-04-24 NOTE — Telephone Encounter (Signed)
[facility] and [facility] and advised her that I was under the impression that this program was for patients that didn't have any insurance coverage. Melissa will check and return my call. Rhonda J Cobb

## 2014-04-28 NOTE — Telephone Encounter (Signed)
I called and spoke with patient who states that he has not met deductible yet and can't afford payments. The CPAP Assistance Program through the [employer] I thought was for patients that do not have any insurance. I will call the program on Monday and see if this patient will qualify being that he has insurance. Pt is aware that I will contact him back on Monday/Tues. Rhonda J Cobb

## 2014-04-28 NOTE — Telephone Encounter (Signed)
Bjorn LoserRhonda have you heard back from [facility]? thanks

## 2014-05-01 NOTE — Telephone Encounter (Signed)
Called and LMOAM for CPAP Assistance Program to return my call. Rhonda J Cobb

## 2014-05-02 NOTE — Telephone Encounter (Signed)
Called CPAP Assistance Program and spoke with Vikki PortsValerie, she stated that either with or without insurance if patient is having a financial hardship getting the equipment (in our opinion) this program can help with providing patient this equipment. Called and spoke with patient and he is going to come up here to see me to sign paperwork to start this process. Pt is aware of the donation fee and that he will be responsible for any additional cpap supplies he may need in the future, that this company will not be able to assist with supplies. Nothing else needed at this time. Rhonda J Cobb

## 2014-05-04 ENCOUNTER — Ambulatory Visit: Payer: No Typology Code available for payment source

## 2014-05-08 ENCOUNTER — Institutional Professional Consult (permissible substitution): Payer: No Typology Code available for payment source | Admitting: Pulmonary Disease

## 2014-05-09 ENCOUNTER — Ambulatory Visit: Payer: No Typology Code available for payment source | Admitting: Internal Medicine

## 2014-05-26 ENCOUNTER — Institutional Professional Consult (permissible substitution): Payer: No Typology Code available for payment source | Admitting: Pulmonary Disease

## 2014-06-02 ENCOUNTER — Ambulatory Visit: Payer: No Typology Code available for payment source

## 2014-07-05 ENCOUNTER — Institutional Professional Consult (permissible substitution): Payer: No Typology Code available for payment source | Admitting: Pulmonary Disease

## 2014-07-11 ENCOUNTER — Other Ambulatory Visit: Payer: Self-pay | Admitting: Internal Medicine

## 2014-08-15 ENCOUNTER — Telehealth: Payer: Self-pay | Admitting: Internal Medicine

## 2014-08-15 ENCOUNTER — Telehealth: Payer: Self-pay | Admitting: Emergency Medicine

## 2014-08-15 NOTE — Telephone Encounter (Signed)
Left VM pt will need OV before referral Last visit 02/25/14 Left scheduler number

## 2014-08-15 NOTE — Telephone Encounter (Signed)
Patient has called in today to see if he can receive a referral for opthalmology; please f/u with patient

## 2014-08-31 ENCOUNTER — Ambulatory Visit: Payer: No Typology Code available for payment source | Admitting: Internal Medicine

## 2014-11-20 ENCOUNTER — Other Ambulatory Visit: Payer: Self-pay | Admitting: Internal Medicine

## 2014-12-12 ENCOUNTER — Encounter (HOSPITAL_COMMUNITY): Payer: Self-pay | Admitting: Emergency Medicine

## 2014-12-12 ENCOUNTER — Emergency Department (HOSPITAL_COMMUNITY)
Admission: EM | Admit: 2014-12-12 | Discharge: 2014-12-12 | Disposition: A | Discharge status: Home / Self Care | Payer: 59 | Attending: Emergency Medicine | Admitting: Emergency Medicine

## 2014-12-12 DIAGNOSIS — Z79899 Other long term (current) drug therapy: Secondary | ICD-10-CM | POA: Insufficient documentation

## 2014-12-12 DIAGNOSIS — M545 Low back pain, unspecified: Secondary | ICD-10-CM

## 2014-12-12 DIAGNOSIS — I10 Essential (primary) hypertension: Secondary | ICD-10-CM | POA: Diagnosis not present

## 2014-12-12 DIAGNOSIS — M62830 Muscle spasm of back: Secondary | ICD-10-CM

## 2014-12-12 DIAGNOSIS — Z720 Tobacco use: Secondary | ICD-10-CM | POA: Insufficient documentation

## 2014-12-12 DIAGNOSIS — M546 Pain in thoracic spine: Secondary | ICD-10-CM | POA: Diagnosis present

## 2014-12-12 DIAGNOSIS — M25511 Pain in right shoulder: Secondary | ICD-10-CM | POA: Insufficient documentation

## 2014-12-12 DIAGNOSIS — E785 Hyperlipidemia, unspecified: Secondary | ICD-10-CM | POA: Insufficient documentation

## 2014-12-12 MED ORDER — METHOCARBAMOL 500 MG PO TABS
1000.0000 mg | ORAL_TABLET | Freq: Once | ORAL | Status: AC
  Administered 2014-12-12: 1000 mg via ORAL
  Filled 2014-12-12: qty 2

## 2014-12-12 MED ORDER — IBUPROFEN 800 MG PO TABS: 800.0000 mg | ORAL_TABLET | Freq: Three times a day (TID) | ORAL | Status: DC | PRN

## 2014-12-12 MED ORDER — METHOCARBAMOL 500 MG PO TABS: 500.0000 mg | ORAL_TABLET | Freq: Three times a day (TID) | ORAL | Status: DC | PRN

## 2014-12-12 MED ORDER — KETOROLAC TROMETHAMINE 30 MG/ML IJ SOLN
60.0000 mg | Freq: Once | INTRAMUSCULAR | Status: AC
  Administered 2014-12-12: 60 mg via INTRAMUSCULAR
  Filled 2014-12-12: qty 2

## 2014-12-12 MED ORDER — TRAMADOL HCL 50 MG PO TABS: 50.0000 mg | ORAL_TABLET | Freq: Four times a day (QID) | ORAL | Status: DC | PRN

## 2014-12-12 NOTE — Discharge Instructions (Signed)
Warm moist heat to the area.  Take medications as prescribed.  Follow-up with your primary care physician or with orthopedics for further workup and evaluation of your upper back pain.   Musculoskeletal Pain Musculoskeletal pain is muscle and boney aches and pains. These pains can occur in any part of the body. Your caregiver may treat you without knowing the cause of the pain. They may treat you if blood or urine tests, X-rays, and other tests were normal.  CAUSES There is often not a definite cause or reason for these pains. These pains may be caused by a type of germ (virus). The discomfort may also come from overuse. Overuse includes working out too hard when your body is not fit. Boney aches also come from weather changes. Bone is sensitive to atmospheric pressure changes. HOME CARE INSTRUCTIONS   Ask when your test results will be ready. Make sure you get your test results.  Only take over-the-counter or prescription medicines for pain, discomfort, or fever as directed by your caregiver. If you were given medications for your condition, do not drive, operate machinery or power tools, or sign legal documents for 24 hours. Do not drink alcohol. Do not take sleeping pills or other medications that may interfere with treatment.  Continue all activities unless the activities cause more pain. When the pain lessens, slowly resume normal activities. Gradually increase the intensity and duration of the activities or exercise.  During periods of severe pain, bed rest may be helpful. Lay or sit in any position that is comfortable.  Putting ice on the injured area.  Put ice in a bag.  Place a towel between your skin and the bag.  Leave the ice on for 15 to 20 minutes, 3 to 4 times a day.  Follow up with your caregiver for continued problems and no reason can be found for the pain. If the pain becomes worse or does not go away, it may be necessary to repeat tests or do additional testing. Your  caregiver may need to look further for a possible cause. SEEK IMMEDIATE MEDICAL CARE IF:  You have pain that is getting worse and is not relieved by medications.  You develop chest pain that is associated with shortness or breath, sweating, feeling sick to your stomach (nauseous), or throw up (vomit).  Your pain becomes localized to the abdomen.  You develop any new symptoms that seem different or that concern you. MAKE SURE YOU:   Understand these instructions.  Will watch your condition.  Will get help right away if you are not doing well or get worse. Document Released: 09/29/2005 Document Revised: 12/22/2011 Document Reviewed: 06/03/2013 [facility] Patient Information 2015 [place]. This information is not intended to replace advice given to you by your health care provider. Make sure you discuss any questions you have with your health care provider.  Muscle Cramps and Spasms Muscle cramps and spasms occur when a muscle or muscles tighten and you have no control over this tightening (involuntary muscle contraction). They are a common problem and can develop in any muscle. The most common place is in the calf muscles of the leg. Both muscle cramps and muscle spasms are involuntary muscle contractions, but they also have differences:   Muscle cramps are sporadic and painful. They may last a few seconds to a quarter of an hour. Muscle cramps are often more forceful and last longer than muscle spasms.  Muscle spasms may or may not be painful. They may also last  just a few seconds or much longer. CAUSES  It is uncommon for cramps or spasms to be due to a serious underlying problem. In many cases, the cause of cramps or spasms is unknown. Some common causes are:   Overexertion.   Overuse from repetitive motions (doing the same thing over and over).   Remaining in a certain position for a long period of time.   Improper preparation, form, or technique while performing a sport  or activity.   Dehydration.   Injury.   Side effects of some medicines.   Abnormally low levels of the salts and ions in your blood (electrolytes), especially potassium and calcium. This could happen if you are taking water pills (diuretics) or you are pregnant.  Some underlying medical problems can make it more likely to develop cramps or spasms. These include, but are not limited to:   Diabetes.   Parkinson disease.   Hormone disorders, such as thyroid problems.   Alcohol abuse.   Diseases specific to muscles, joints, and bones.   Blood vessel disease where not enough blood is getting to the muscles.  HOME CARE INSTRUCTIONS   Stay well hydrated. Drink enough water and fluids to keep your urine clear or pale yellow.  It may be helpful to massage, stretch, and relax the affected muscle.  For tight or tense muscles, use a warm towel, heating pad, or hot shower water directed to the affected area.  If you are sore or have pain after a cramp or spasm, applying ice to the affected area may relieve discomfort.  Put ice in a plastic bag.  Place a towel between your skin and the bag.  Leave the ice on for 15-20 minutes, 03-04 times a day.  Medicines used to treat a known cause of cramps or spasms may help reduce their frequency or severity. Only take over-the-counter or prescription medicines as directed by your caregiver. SEEK MEDICAL CARE IF:  Your cramps or spasms get more severe, more frequent, or do not improve over time.  MAKE SURE YOU:   Understand these instructions.  Will watch your condition.  Will get help right away if you are not doing well or get worse. Document Released: 03/21/2002 Document Revised: 01/24/2013 Document Reviewed: 09/15/2012 [facility] Patient Information 2015 [place], [place]. This information is not intended to replace advice given to you by your health care provider. Make sure you discuss any questions you have with your health care  provider.  Heat Therapy Heat therapy can help make painful, stiff muscles and joints feel better. Do not use heat on new injuries. Wait at least 48 hours after an injury to use heat. Do not use heat when you have aches or pains right after an activity. If you still have pain 3 hours after stopping the activity, then you may use heat. HOME CARE Wet heat pack  Soak a clean towel in warm water. Squeeze out the extra water.  Put the warm, wet towel in a plastic bag.  Place a thin, dry towel between your skin and the bag.  Put the heat pack on the area for 5 minutes, and check your skin. Your skin may be pink, but it should not be red.  Leave the heat pack on the area for 15 to 30 minutes.  Repeat this every 2 to 4 hours while awake. Do not use heat while you are sleeping. Warm water bath  Fill a tub with warm water.  Place the affected body part in the  tub.  Soak the area for 20 to 40 minutes.  Repeat as needed. Hot water bottle  Fill the water bottle half full with hot water.  Press out the extra air. Close the cap tightly.  Place a dry towel between your skin and the bottle.  Put the bottle on the area for 5 minutes, and check your skin. Your skin may be pink, but it should not be red.  Leave the bottle on the area for 15 to 30 minutes.  Repeat this every 2 to 4 hours while awake. Electric heating pad  Place a dry towel between your skin and the heating pad.  Set the heating pad on low heat.  Put the heating pad on the area for 10 minutes, and check your skin. Your skin may be pink, but it should not be red.  Leave the heating pad on the area for 20 to 40 minutes.  Repeat this every 2 to 4 hours while awake.  Do not lie on the heating pad.  Do not fall asleep while using the heating pad.  Do not use the heating pad near water. GET HELP RIGHT AWAY IF:  You get blisters or red skin.  Your skin is puffy (swollen), or you lose feeling (numbness) in the affected  area.  You have any new problems.  Your problems are getting worse.  You have any questions or concerns. If you have any problems, stop using heat therapy until you see your doctor. MAKE SURE YOU:  Understand these instructions.  Will watch your condition.  Will get help right away if you are not doing well or get worse. Document Released: 12/22/2011 Document Reviewed: 11/22/2013 [facility] Patient Information 2015 [place], [place]. This information is not intended to replace advice given to you by your health care provider. Make sure you discuss any questions you have with your health care provider.
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
## 2014-12-12 NOTE — ED Notes (Signed)
The patient said he has had back pain and shoulder pain for three weeks now.  He denies injury and says his pain has gotten worse instead of better.   He rates his pain 10/10. The patient says he took 800mg  of iburprofen.

## 2014-12-14 ENCOUNTER — Ambulatory Visit: Payer: 59 | Attending: Internal Medicine | Admitting: Family Medicine

## 2014-12-14 VITALS — BP 147/99 | HR 75 | Temp 97.9°F | Resp 16 | Ht 67.0 in | Wt 213.0 lb

## 2014-12-14 DIAGNOSIS — M549 Dorsalgia, unspecified: Secondary | ICD-10-CM | POA: Insufficient documentation

## 2014-12-14 DIAGNOSIS — M546 Pain in thoracic spine: Secondary | ICD-10-CM | POA: Insufficient documentation

## 2014-12-14 NOTE — Patient Instructions (Signed)
Musculoskeletal Pain Musculoskeletal pain is muscle and boney aches and pains. These pains can occur in any part of the body. Your caregiver may treat you without knowing the cause of the pain. They may treat you if blood or urine tests, X-rays, and other tests were normal.  CAUSES There is often not a definite cause or reason for these pains. These pains may be caused by a type of germ (virus). The discomfort may also come from overuse. Overuse includes working out too hard when your body is not fit. Boney aches also come from weather changes. Bone is sensitive to atmospheric pressure changes. HOME CARE INSTRUCTIONS   Ask when your test results will be ready. Make sure you get your test results.  Only take over-the-counter or prescription medicines for pain, discomfort, or fever as directed by your caregiver. If you were given medications for your condition, do not drive, operate machinery or power tools, or sign legal documents for 24 hours. Do not drink alcohol. Do not take sleeping pills or other medications that may interfere with treatment.  Continue all activities unless the activities cause more pain. When the pain lessens, slowly resume normal activities. Gradually increase the intensity and duration of the activities or exercise.  During periods of severe pain, bed rest may be helpful. Lay or sit in any position that is comfortable.  Putting ice on the injured area.  Put ice in a bag.  Place a towel between your skin and the bag.  Leave the ice on for 15 to 20 minutes, 3 to 4 times a day.  Follow up with your caregiver for continued problems and no reason can be found for the pain. If the pain becomes worse or does not go away, it may be necessary to repeat tests or do additional testing. Your caregiver may need to look further for a possible cause. SEEK IMMEDIATE MEDICAL CARE IF:  You have pain that is getting worse and is not relieved by medications.  You develop chest pain  that is associated with shortness or breath, sweating, feeling sick to your stomach (nauseous), or throw up (vomit).  Your pain becomes localized to the abdomen.  You develop any new symptoms that seem different or that concern you. MAKE SURE YOU:   Understand these instructions.  Will watch your condition.  Will get help right away if you are not doing well or get worse. Document Released: 09/29/2005 Document Revised: 12/22/2011 Document Reviewed: 06/03/2013 [facility] Patient Information 2015 [place], [place]. This information is not intended to replace advice given to you by your health care provider. Make sure you discuss any questions you have with your health care provider. If having intense pain use ice to override the painful sensation. Use heat to area several times a day. Do gentle range of motion, avoiding positions that intensify the pain. Use medications prescribed in ED as prescribe.

## 2014-12-14 NOTE — Assessment & Plan Note (Addendum)
Patient is alert, oriented, sitting with right arm over his head with hand resting on head. He has FROM ofd the ARM and shoulder with some discomfort. Grips are strong and equal. There is fullness and tenderness over the right shoulder blade    Plan: referral to PT. Instructions for care provided.

## 2014-12-14 NOTE — Progress Notes (Signed)
Pain in back, riht hip and shoulder started hurting 3 weeks ago Went to the ED and was told it was most likely muscle spasms Patient given Rx for Ibuprofen,methocarbomal, and Tramadol in ED.

## 2015-05-13 ENCOUNTER — Emergency Department (HOSPITAL_COMMUNITY)
Admission: EM | Admit: 2015-05-13 | Discharge: 2015-05-13 | Disposition: A | Discharge status: Home / Self Care | Payer: 59 | Attending: Emergency Medicine | Admitting: Emergency Medicine

## 2015-05-13 ENCOUNTER — Encounter (HOSPITAL_COMMUNITY): Payer: Self-pay | Admitting: *Deleted

## 2015-05-13 DIAGNOSIS — T148XXA Other injury of unspecified body region, initial encounter: Secondary | ICD-10-CM

## 2015-05-13 DIAGNOSIS — Z720 Tobacco use: Secondary | ICD-10-CM | POA: Insufficient documentation

## 2015-05-13 DIAGNOSIS — Y9289 Other specified places as the place of occurrence of the external cause: Secondary | ICD-10-CM | POA: Insufficient documentation

## 2015-05-13 DIAGNOSIS — S0083XA Contusion of other part of head, initial encounter: Secondary | ICD-10-CM | POA: Insufficient documentation

## 2015-05-13 DIAGNOSIS — E785 Hyperlipidemia, unspecified: Secondary | ICD-10-CM | POA: Insufficient documentation

## 2015-05-13 DIAGNOSIS — Y998 Other external cause status: Secondary | ICD-10-CM | POA: Insufficient documentation

## 2015-05-13 DIAGNOSIS — I10 Essential (primary) hypertension: Secondary | ICD-10-CM | POA: Insufficient documentation

## 2015-05-13 DIAGNOSIS — Y9389 Activity, other specified: Secondary | ICD-10-CM | POA: Insufficient documentation
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
## 2015-05-13 NOTE — ED Notes (Signed)
Declined W/C at D/C and was escorted to lobby by RN. 
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
## 2015-05-13 NOTE — Discharge Instructions (Signed)
Hematoma A hematoma is a collection of blood under the skin, in an organ, in a body space, in a joint space, or in other tissue. The blood can clot to form a lump that you can see and feel. The lump is often firm and may sometimes become sore and tender. Most hematomas get better in a few days to weeks. However, some hematomas may be serious and require medical care. Hematomas can range in size from very small to very large. CAUSES  A hematoma can be caused by a blunt or penetrating injury. It can also be caused by spontaneous leakage from a blood vessel under the skin. Spontaneous leakage from a blood vessel is more likely to occur in older people, especially those taking blood thinners. Sometimes, a hematoma can develop after certain medical procedures. SIGNS AND SYMPTOMS   A firm lump on the body.  Possible pain and tenderness in the area.  Bruising.Blue, dark blue, purple-red, or yellowish skin may appear at the site of the hematoma if the hematoma is close to the surface of the skin. For hematomas in deeper tissues or body spaces, the signs and symptoms may be subtle. For example, an intra-abdominal hematoma may cause abdominal pain, weakness, fainting, and shortness of breath. An intracranial hematoma may cause a headache or symptoms such as weakness, trouble speaking, or a change in consciousness. DIAGNOSIS  A hematoma can usually be diagnosed based on your medical history and a physical exam. Imaging tests may be needed if your health care provider suspects a hematoma in deeper tissues or body spaces, such as the abdomen, head, or chest. These tests may include ultrasonography or a CT scan.  TREATMENT  Hematomas usually go away on their own over time. Rarely does the blood need to be drained out of the body. Large hematomas or those that may affect vital organs will sometimes need surgical drainage or monitoring. HOME CARE INSTRUCTIONS   Apply ice to the injured area:   Put ice in a  plastic bag.   Place a towel between your skin and the bag.   Leave the ice on for 20 minutes, 2-3 times a day for the first 1 to 2 days.   After the first 2 days, switch to using warm compresses on the hematoma.   Elevate the injured area to help decrease pain and swelling. Wrapping the area with an elastic bandage may also be helpful. Compression helps to reduce swelling and promotes shrinking of the hematoma. Make sure the bandage is not wrapped too tight.   If your hematoma is on a lower extremity and is painful, crutches may be helpful for a couple days.   Only take over-the-counter or prescription medicines as directed by your health care provider. SEEK IMMEDIATE MEDICAL CARE IF:   You have increasing pain, or your pain is not controlled with medicine.   You have a fever.   You have worsening swelling or discoloration.   Your skin over the hematoma breaks or starts bleeding.   Your hematoma is in your chest or abdomen and you have weakness, shortness of breath, or a change in consciousness.  Your hematoma is on your scalp (caused by a fall or injury) and you have a worsening headache or a change in alertness or consciousness. MAKE SURE YOU:   Understand these instructions.  Will watch your condition.  Will get help right away if you are not doing well or get worse. Document Released: 05/13/2004 Document Revised: 06/01/2013 Document Reviewed: 03/09/2013   ExitCare Patient Information 2015 [place], [place]. This information is not intended to replace advice given to you by your health care provider. Make sure you discuss any questions you have with your health care provider.  Contusion A contusion is a deep bruise. Contusions are the result of an injury that caused bleeding under the skin. The contusion may turn blue, purple, or yellow. Minor injuries will give you a painless contusion, but more severe contusions may stay painful and swollen for a few weeks.  CAUSES    A contusion is usually caused by a blow, trauma, or direct force to an area of the body. SYMPTOMS   Swelling and redness of the injured area.  Bruising of the injured area.  Tenderness and soreness of the injured area.  Pain. DIAGNOSIS  The diagnosis can be made by taking a history and physical exam. An X-ray, CT scan, or MRI may be needed to determine if there were any associated injuries, such as fractures. TREATMENT  Specific treatment will depend on what area of the body was injured. In general, the best treatment for a contusion is resting, icing, elevating, and applying cold compresses to the injured area. Over-the-counter medicines may also be recommended for pain control. Ask your caregiver what the best treatment is for your contusion. HOME CARE INSTRUCTIONS   Put ice on the injured area.  Put ice in a plastic bag.  Place a towel between your skin and the bag.  Leave the ice on for 15-20 minutes, 3-4 times a day, or as directed by your health care provider.  Only take over-the-counter or prescription medicines for pain, discomfort, or fever as directed by your caregiver. Your caregiver may recommend avoiding anti-inflammatory medicines (aspirin, ibuprofen, and naproxen) for 48 hours because these medicines may increase bruising.  Rest the injured area.  If possible, elevate the injured area to reduce swelling. SEEK IMMEDIATE MEDICAL CARE IF:   You have increased bruising or swelling.  You have pain that is getting worse.  Your swelling or pain is not relieved with medicines. MAKE SURE YOU:   Understand these instructions.  Will watch your condition.  Will get help right away if you are not doing well or get worse. Document Released: 07/09/2005 Document Revised: 10/04/2013 Document Reviewed: 08/04/2011 [facility] Patient Information 2015 [place], [place]. This information is not intended to replace advice given to you by your health care provider. Make sure you  discuss any questions you have with your health care provider.   Please monitor for new or worsening signs or symptoms return immediately if any present. CT scan was offered and even though you declined please return immediately for further evaluation if symptoms worsen. Tylenol or Ibuprofen as needed for pain.

## 2015-06-13 ENCOUNTER — Encounter (HOSPITAL_COMMUNITY): Payer: Self-pay | Admitting: Emergency Medicine

## 2015-06-13 ENCOUNTER — Emergency Department (HOSPITAL_COMMUNITY)
Admission: EM | Admit: 2015-06-13 | Discharge: 2015-06-13 | Disposition: A | Discharge status: Home / Self Care | Payer: No Typology Code available for payment source | Attending: Emergency Medicine | Admitting: Emergency Medicine

## 2015-06-13 ENCOUNTER — Emergency Department (HOSPITAL_COMMUNITY): Payer: No Typology Code available for payment source

## 2015-06-13 DIAGNOSIS — Z720 Tobacco use: Secondary | ICD-10-CM | POA: Diagnosis not present

## 2015-06-13 DIAGNOSIS — Y998 Other external cause status: Secondary | ICD-10-CM | POA: Insufficient documentation

## 2015-06-13 DIAGNOSIS — Y92410 Unspecified street and highway as the place of occurrence of the external cause: Secondary | ICD-10-CM | POA: Insufficient documentation

## 2015-06-13 DIAGNOSIS — S39012A Strain of muscle, fascia and tendon of lower back, initial encounter: Secondary | ICD-10-CM | POA: Diagnosis not present

## 2015-06-13 DIAGNOSIS — S199XXA Unspecified injury of neck, initial encounter: Secondary | ICD-10-CM | POA: Diagnosis not present

## 2015-06-13 DIAGNOSIS — I10 Essential (primary) hypertension: Secondary | ICD-10-CM | POA: Insufficient documentation

## 2015-06-13 DIAGNOSIS — Y9389 Activity, other specified: Secondary | ICD-10-CM | POA: Diagnosis not present

## 2015-06-13 DIAGNOSIS — S3992XA Unspecified injury of lower back, initial encounter: Secondary | ICD-10-CM | POA: Diagnosis present

## 2015-06-13 DIAGNOSIS — S79911A Unspecified injury of right hip, initial encounter: Secondary | ICD-10-CM | POA: Diagnosis not present

## 2015-06-13 DIAGNOSIS — E785 Hyperlipidemia, unspecified: Secondary | ICD-10-CM | POA: Insufficient documentation

## 2015-06-13 MED ORDER — NAPROXEN 500 MG PO TABS: 500.0000 mg | ORAL_TABLET | Freq: Two times a day (BID) | ORAL | Status: DC

## 2015-06-13 MED ORDER — OXYCODONE-ACETAMINOPHEN 5-325 MG PO TABS: 1.0000 | ORAL_TABLET | Freq: Four times a day (QID) | ORAL | Status: DC | PRN

## 2015-06-13 MED ORDER — NAPROXEN 250 MG PO TABS
500.0000 mg | ORAL_TABLET | Freq: Once | ORAL | Status: AC
  Administered 2015-06-13: 500 mg via ORAL
  Filled 2015-06-13: qty 2

## 2015-06-13 MED ORDER — OXYCODONE-ACETAMINOPHEN 5-325 MG PO TABS
1.0000 | ORAL_TABLET | Freq: Once | ORAL | Status: AC
  Administered 2015-06-13: 1 via ORAL
  Filled 2015-06-13: qty 1
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
## 2015-11-22 ENCOUNTER — Encounter: Payer: Self-pay | Admitting: Internal Medicine

## 2015-11-22 ENCOUNTER — Ambulatory Visit: Payer: Self-pay | Attending: Internal Medicine | Admitting: Internal Medicine

## 2015-11-22 DIAGNOSIS — Z79899 Other long term (current) drug therapy: Secondary | ICD-10-CM | POA: Insufficient documentation

## 2015-11-22 DIAGNOSIS — G4733 Obstructive sleep apnea (adult) (pediatric): Secondary | ICD-10-CM | POA: Insufficient documentation

## 2015-11-22 DIAGNOSIS — F17210 Nicotine dependence, cigarettes, uncomplicated: Secondary | ICD-10-CM | POA: Insufficient documentation

## 2015-11-22 DIAGNOSIS — M545 Low back pain, unspecified: Secondary | ICD-10-CM

## 2015-11-22 DIAGNOSIS — I10 Essential (primary) hypertension: Secondary | ICD-10-CM | POA: Insufficient documentation

## 2015-11-22 DIAGNOSIS — E785 Hyperlipidemia, unspecified: Secondary | ICD-10-CM | POA: Insufficient documentation

## 2015-11-22 DIAGNOSIS — Z760 Encounter for issue of repeat prescription: Secondary | ICD-10-CM | POA: Insufficient documentation

## 2015-11-22 LAB — POCT GLYCOSYLATED HEMOGLOBIN (HGB A1C): HEMOGLOBIN A1C: 5.1

## 2015-11-22 MED ORDER — METHOCARBAMOL 500 MG PO TABS: 500.0000 mg | ORAL_TABLET | Freq: Three times a day (TID) | ORAL | Status: DC | PRN

## 2015-11-22 MED ORDER — ACETAMINOPHEN-CODEINE #3 300-30 MG PO TABS: 1.0000 | ORAL_TABLET | ORAL | Status: DC | PRN

## 2015-11-22 NOTE — Progress Notes (Signed)
Patient is here for Pain  Patient complains of chronic lower back pain being present and currently scaled at an 8. Pain is described as constant aching with intermittent sharp pains.  Patient declined the flu shot at this time.

## 2015-11-22 NOTE — Progress Notes (Signed)
Gregory Gay, is a 50 y.o. male  ZOX:[id number]  WJX:[id number]  DOB - Dec 02, 1965  CC:  Chief Complaint  Patient presents with  . Medication Refill    HPI: Gregory Gay is a 50 y.o. male here today for a follow up visit. Patient has history of hypertension, hyperlipidemia, severe low back pain and obstructive sleep apnea on (CPAP), that he borrowed from a friend. CPAP machine needs new hoses and face mask. Patient is a current smoker and admits to smoking 1/2 a pack per day. Patient has a 30+ year pack history. He has been out of all of his medications for the last 3 months and presents today requesting medication refills. Patient's complaint of low back pain which radiates to his right leg and knee with movement. Patient rates pain as 8 of 10 on most days and occasionally 5 of 10. Pain is sharp, and burning. Patient takes Aleve and Naproxen for the pain with minimal relief. Patient is currently working at [employer] and is able to perform the activities required by his job. Patient to have colonoscopy in May, 2017.  No Known Allergies Past Medical History  Diagnosis Date  . Back pain   . Hypertension   . Hyperlipidemia    Current Outpatient Prescriptions on File Prior to Visit  Medication Sig Dispense Refill  . lovastatin (MEVACOR) 20 MG tablet Take 1 tablet (20 mg total) by mouth every morning. (Patient not taking: Reported on 11/22/2015) 90 tablet 3  . valsartan (DIOVAN) 80 MG tablet Take 1 tablet (80 mg total) by mouth daily. 30 tablet 11   No current facility-administered medications on file prior to visit.   History reviewed. No pertinent family history. Social History   Social History  . Marital Status: Single    Spouse Name: N/A  . Number of Children: N/A  . Years of Education: N/A   Occupational History  . [employer] work    Social History Main Topics  . Smoking status: Current Every Day Smoker -- 0.25 packs/day for 12 years    Types: Cigarettes  .  Smokeless tobacco: Not on file  . Alcohol Use: No  . Drug Use: No  . Sexual Activity: Not on file   Other Topics Concern  . Not on file   Social History Narrative    Review of Systems: Constitutional: Negative for fever, chills, diaphoresis, activity change, appetite change and fatigue. HENT: Negative for ear pain, nosebleeds, congestion, facial swelling, rhinorrhea, neck pain, neck stiffness and ear discharge.  Eyes: Negative for pain, discharge, redness, itching and visual disturbance. Respiratory: Negative for cough, choking, chest tightness, shortness of breath, wheezing and stridor.  Cardiovascular: Negative for chest pain, palpitations and leg swelling. Gastrointestinal: Negative for abdominal distention. Genitourinary: Negative for dysuria, urgency, frequency, hematuria, flank pain, decreased urine volume, difficulty urinating and dyspareunia.  Musculoskeletal: Negative for back pain, joint swelling, arthralgia and gait problem. Neurological: Negative for dizziness, tremors, seizures, syncope, facial asymmetry, speech difficulty, weakness, light-headedness, numbness and headaches.  Hematological: Negative for adenopathy. Does not bruise/bleed easily. Psychiatric/Behavioral: Negative for hallucinations, behavioral problems, confusion, dysphoric mood, decreased concentration and agitation.    Objective:   Filed Vitals:   11/22/15 1643  BP: 122/75  Pulse: 78  Temp: 98.7 F (37.1 C)  Resp: 18    Physical Exam: Constitutional: Patient appears well-developed and well-nourished. No distress. HENT: Normocephalic, atraumatic, External right and left ear normal. Oropharynx is clear and moist.  Eyes: Conjunctivae and EOM are normal. PERRLA, no scleral  icterus. Neck: Normal ROM. Neck supple. No JVD. No tracheal deviation. No thyromegaly. CVS: RRR, S1/S2 +, no murmurs, no gallops, no carotid bruit.  Pulmonary: Effort and breath sounds normal, no stridor, rhonchi, wheezes, rales.    Abdominal: Soft. BS +, no distension, tenderness, rebound or guarding.  Musculoskeletal: Decreased range of motion in lower back and right leg. No edema and no tenderness.  Lymphadenopathy: No lymphadenopathy noted. Neuro: Alert x Oriented x 4 Skin: Skin is warm and dry. No rash noted. Not diaphoretic. No erythema. No pallor. Psychiatric: Normal mood and affect. Behavior, judgment, thought content normal.  Lab Results  Component Value Date   WBC 8.0 06/03/2013   HGB 15.3 06/03/2013   HCT 44.8 06/03/2013   MCV 91.4 06/03/2013   PLT 235 06/03/2013   Lab Results  Component Value Date   CREATININE 0.90 02/27/2014   BUN 12 02/27/2014   NA 140 02/27/2014   K 4.1 02/27/2014   CL 104 02/27/2014   CO2 28 02/27/2014    Lab Results  Component Value Date   HGBA1C 5.10 11/22/2015   Lipid Panel     Component Value Date/Time   CHOL 188 02/27/2014 1037   TRIG 97 02/27/2014 1037   HDL 45 02/27/2014 1037   CHOLHDL 4.2 02/27/2014 1037   VLDL 19 02/27/2014 1037   LDLCALC 124* 02/27/2014 1037       Assessment and plan:   Arles was seen today for medication refill.  Diagnoses and all orders for this visit:  Bilateral low back pain without sciatica  -     acetaminophen-codeine (TYLENOL #3) 300-30 MG tablet; Take 1 tablet by mouth every 4 (four) hours as needed. -     methocarbamol (ROBAXIN) 500 MG tablet; Take 1 tablet (500 mg total) by mouth every 8 (eight) hours as needed for muscle spasms. -     COMPLETE METABOLIC PANEL WITH GFR -     POCT glycosylated hemoglobin (Hb A1C) -     Lipid panel -     Urinalysis, Complete  Patient encouraged to complete stretching and strengthening exercises for lower back two to three times per day. Exercises demonstrated and patient able to perform return demonstration.   OSA (obstructive sleep apnea)  Patient needs new hoses and masks for his borrowed sleep apnea machine. Patient refuses repeat sleep study at this time citing costs  associated with study and purchase of new machine.   Return in about 6 months (around 05/21/2016) for Follow up Pain and comorbidities, Annual Physical.  The patient was given clear instructions to go to ER or return to medical center if symptoms don't improve, worsen or new problems develop. The patient verbalized understanding. The patient was told to call to get lab results if they haven't heard anything in the next week.     Stephanie Coup, NP-C [facility] [phone] 11/22/2015, 6:45 PM   Evaluation and management procedures were performed by the Advanced Practitioner under my supervision and collaboration. I have reviewed the Advanced Practitioner's note and chart, and I agree with the management and plan.   Jeanann Lewandowsky, MD, MHA, CPE, FACP, FAAP [facility] [place], [place] [phone]   11/26/2015, 6:59 PM

## 2015-11-22 NOTE — Patient Instructions (Signed)

## 2015-11-23 LAB — URINALYSIS, COMPLETE
BILIRUBIN URINE: NEGATIVE
CRYSTALS: NONE SEEN [HPF]
Casts: NONE SEEN [LPF]
Glucose, UA: NEGATIVE
KETONES UR: NEGATIVE
LEUKOCYTES UA: NEGATIVE
NITRITE: NEGATIVE
PH: 6 (ref 5.0–8.0)
Protein, ur: NEGATIVE
SPECIFIC GRAVITY, URINE: 1.03 (ref 1.001–1.035)
Yeast: NONE SEEN [HPF]

## 2015-11-23 LAB — COMPLETE METABOLIC PANEL WITH GFR
ALBUMIN: 3.9 g/dL (ref 3.6–5.1)
ALK PHOS: 59 U/L (ref 40–115)
ALT: 14 U/L (ref 9–46)
AST: 22 U/L (ref 10–40)
BILIRUBIN TOTAL: 0.7 mg/dL (ref 0.2–1.2)
BUN: 16 mg/dL (ref 7–25)
CO2: 24 mmol/L (ref 20–31)
CREATININE: 0.89 mg/dL (ref 0.60–1.35)
Calcium: 9 mg/dL (ref 8.6–10.3)
Chloride: 107 mmol/L (ref 98–110)
GFR, Est African American: >89 mL/min (ref >=60)
GLUCOSE: 79 mg/dL (ref 65–99)
Potassium: 4.1 mmol/L (ref 3.5–5.3)
SODIUM: 139 mmol/L (ref 135–146)
TOTAL PROTEIN: 6.2 g/dL (ref 6.1–8.1)

## 2015-11-23 LAB — LIPID PANEL
Cholesterol: 173 mg/dL (ref 125–200)
HDL: 44 mg/dL (ref >=40)
LDL CALC: 104 mg/dL (ref <130)
Total CHOL/HDL Ratio: 3.9 Ratio (ref <=5.0)
Triglycerides: 127 mg/dL (ref <150)
VLDL: 25 mg/dL (ref <30)

## 2015-11-23 MED FILL — ACETAMINOPHEN/COD #3 TABLET: 300-30 | 10 days supply | Qty: 60 | Fill #0

## 2015-11-23 MED FILL — METHOCARBAMOL 500 MG TABLET: 500 | 20 days supply | Qty: 60 | Fill #0

## 2015-11-27 ENCOUNTER — Telehealth: Payer: Self-pay | Admitting: *Deleted

## 2015-11-27 NOTE — Telephone Encounter (Signed)
Medical Assistant left message on patient's home and cell voicemail. Voicemail states to give a call back to Arslan Kier with [facility] at [phone].  

## 2015-11-27 NOTE — Telephone Encounter (Signed)
-----   Message from Quentin Angst, MD sent at 11/26/2015  4:00 PM EST ----- Please inform patient that his laboratory results are normal.

## 2015-11-28 NOTE — Telephone Encounter (Signed)
Medical Assistant left message on patient's home and cell voicemail. Voicemail states to give a call back to Vernee Baines with [facility] at [phone].  

## 2015-12-03 ENCOUNTER — Ambulatory Visit: Payer: Self-pay | Attending: Internal Medicine

## 2015-12-03 NOTE — Telephone Encounter (Signed)
Medical Assistant left message on patient's home and cell voicemail. Voicemail states to give a call back to [place] with [facility] at [phone].  !!!Communication letter has been mailed!!!

## 2016-09-11 ENCOUNTER — Ambulatory Visit: Payer: Self-pay | Attending: Internal Medicine | Admitting: Internal Medicine

## 2016-09-11 ENCOUNTER — Encounter: Payer: Self-pay | Admitting: Internal Medicine

## 2016-09-11 ENCOUNTER — Ambulatory Visit: Payer: Self-pay

## 2016-09-11 VITALS — BP 122/79 | HR 84 | Temp 97.8°F | Wt 226.0 lb

## 2016-09-11 DIAGNOSIS — I10 Essential (primary) hypertension: Secondary | ICD-10-CM | POA: Insufficient documentation

## 2016-09-11 DIAGNOSIS — G8929 Other chronic pain: Secondary | ICD-10-CM | POA: Insufficient documentation

## 2016-09-11 DIAGNOSIS — M545 Low back pain: Secondary | ICD-10-CM | POA: Insufficient documentation

## 2016-09-11 DIAGNOSIS — G4733 Obstructive sleep apnea (adult) (pediatric): Secondary | ICD-10-CM | POA: Insufficient documentation

## 2016-09-11 DIAGNOSIS — E785 Hyperlipidemia, unspecified: Secondary | ICD-10-CM | POA: Insufficient documentation

## 2016-09-11 DIAGNOSIS — Z79899 Other long term (current) drug therapy: Secondary | ICD-10-CM | POA: Insufficient documentation

## 2016-09-11 MED ORDER — ACETAMINOPHEN-CODEINE #3 300-30 MG PO TABS: 1.0000 | ORAL_TABLET | ORAL | 0 refills | Status: DC | PRN

## 2016-09-11 MED ORDER — METHOCARBAMOL 500 MG PO TABS: 500.0000 mg | ORAL_TABLET | Freq: Three times a day (TID) | ORAL | 3 refills | Status: DC | PRN

## 2016-09-11 MED ORDER — LOVASTATIN 20 MG PO TABS: 20.0000 mg | ORAL_TABLET | Freq: Every morning | ORAL | 3 refills | Status: DC

## 2016-09-11 MED ORDER — DICLOFENAC SODIUM 1 % TD GEL: 4.0000 g | Freq: Four times a day (QID) | TRANSDERMAL | 1 refills | Status: DC

## 2016-09-11 MED ORDER — VALSARTAN 80 MG PO TABS: 80.0000 mg | ORAL_TABLET | Freq: Every day | ORAL | 3 refills | Status: DC

## 2016-09-11 MED FILL — LOVASTATIN 20 MG TABLET: 20 | 30 days supply | Qty: 30 | Fill #0

## 2016-09-11 MED FILL — ?VALSARTAN 80 MG TABLET: 80 | 30 days supply | Qty: 30 | Fill #0

## 2016-09-11 MED FILL — DICLOFENAC SODIUM 1% GEL: 1 | 12 days supply | Qty: 200 | Fill #0

## 2016-09-11 MED FILL — METHOCARBAMOL 500 MG TABLET: 500 | 20 days supply | Qty: 60 | Fill #0

## 2016-09-11 NOTE — Progress Notes (Signed)
Pt c/o progressive lower and hip back pain , constant, spasm and discomfort. Does not get relief when taking Ibuprofen. Denies any urinary changes or incontinence. Request rx for Tylenol #3.   Out of medications for 2 days, asks for medication RF: Enalapril, HCTZ, loavastatin.

## 2016-09-11 NOTE — Patient Instructions (Signed)
Back Pain, Adult Back pain is very common in adults.The cause of back pain is rarely dangerous and the pain often gets better over time.The cause of your back pain may not be known. Some common causes of back pain include:  Strain of the muscles or ligaments supporting the spine.  Wear and tear (degeneration) of the spinal disks.  Arthritis.  Direct injury to the back. For many people, back pain may return. Since back pain is rarely dangerous, most people can learn to manage this condition on their own. Follow these instructions at home: Watch your back pain for any changes. The following actions may help to lessen any discomfort you are feeling:  Remain active. It is stressful on your back to sit or stand in one place for long periods of time. Do not sit, drive, or stand in one place for more than 30 minutes at a time. Take short walks on even surfaces as soon as you are able.Try to increase the length of time you walk each day.  Exercise regularly as directed by your health care provider. Exercise helps your back heal faster. It also helps avoid future injury by keeping your muscles strong and flexible.  Do not stay in bed.Resting more than 1-2 days can delay your recovery.  Pay attention to your body when you bend and lift. The most comfortable positions are those that put less stress on your recovering back. Always use proper lifting techniques, including:  Bending your knees.  Keeping the load close to your body.  Avoiding twisting.  Find a comfortable position to sleep. Use a firm mattress and lie on your side with your knees slightly bent. If you lie on your back, put a pillow under your knees.  Avoid feeling anxious or stressed.Stress increases muscle tension and can worsen back pain.It is important to recognize when you are anxious or stressed and learn ways to manage it, such as with exercise.  Take medicines only as directed by your health care provider.  Over-the-counter medicines to reduce pain and inflammation are often the most helpful.Your health care provider may prescribe muscle relaxant drugs.These medicines help dull your pain so you can more quickly return to your normal activities and healthy exercise.  Apply ice to the injured area:  Put ice in a plastic bag.  Place a towel between your skin and the bag.  Leave the ice on for 20 minutes, 2-3 times a day for the first 2-3 days. After that, ice and heat may be alternated to reduce pain and spasms.  Maintain a healthy weight. Excess weight puts extra stress on your back and makes it difficult to maintain good posture. Contact a health care provider if:  You have pain that is not relieved with rest or medicine.  You have increasing pain going down into the legs or buttocks.  You have pain that does not improve in one week.  You have night pain.  You lose weight.  You have a fever or chills. Get help right away if:  You develop new bowel or bladder control problems.  You have unusual weakness or numbness in your arms or legs.  You develop nausea or vomiting.  You develop abdominal pain.  You feel faint. This information is not intended to replace advice given to you by your health care provider. Make sure you discuss any questions you have with your health care provider. Document Released: 09/29/2005 Document Revised: 02/07/2016 Document Reviewed: 01/31/2014 Elsevier Interactive Patient Education  2017 Elsevier   Inc. Hypertension Hypertension, commonly called high blood pressure, is when the force of blood pumping through your arteries is too strong. Your arteries are the blood vessels that carry blood from your heart throughout your body. A blood pressure reading consists of a higher number over a lower number, such as 110/72. The higher number (systolic) is the pressure inside your arteries when your heart pumps. The lower number (diastolic) is the pressure inside  your arteries when your heart relaxes. Ideally you want your blood pressure below 120/80. Hypertension forces your heart to work harder to pump blood. Your arteries may become narrow or stiff. Having untreated or uncontrolled hypertension can cause heart attack, stroke, kidney disease, and other problems. What increases the risk? Some risk factors for high blood pressure are controllable. Others are not. Risk factors you cannot control include:  Race. You may be at higher risk if you are African American.  Age. Risk increases with age.  Gender. Men are at higher risk than women before age 50 years. After age 50, women are at higher risk than men. Risk factors you can control include:  Not getting enough exercise or physical activity.  Being overweight.  Getting too much fat, sugar, calories, or salt in your diet.  Drinking too much alcohol. What are the signs or symptoms? Hypertension does not usually cause signs or symptoms. Extremely high blood pressure (hypertensive crisis) may cause headache, anxiety, shortness of breath, and nosebleed. How is this diagnosed? To check if you have hypertension, your health care provider will measure your blood pressure while you are seated, with your arm held at the level of your heart. It should be measured at least twice using the same arm. Certain conditions can cause a difference in blood pressure between your right and left arms. A blood pressure reading that is higher than normal on one occasion does not mean that you need treatment. If it is not clear whether you have high blood pressure, you may be asked to return on a different day to have your blood pressure checked again. Or, you may be asked to monitor your blood pressure at home for 1 or more weeks. How is this treated? Treating high blood pressure includes making lifestyle changes and possibly taking medicine. Living a healthy lifestyle can help lower high blood pressure. You may need to  change some of your habits. Lifestyle changes may include:  Following the DASH diet. This diet is high in fruits, vegetables, and whole grains. It is low in salt, red meat, and added sugars.  Keep your sodium intake below 2,300 mg per day.  Getting at least 30-45 minutes of aerobic exercise at least 4 times per week.  Losing weight if necessary.  Not smoking.  Limiting alcoholic beverages.  Learning ways to reduce stress. Your health care provider may prescribe medicine if lifestyle changes are not enough to get your blood pressure under control, and if one of the following is true:  You are 7518-50 years of age and your systolic blood pressure is above 140.  You are 50 years of age or older, and your systolic blood pressure is above 150.  Your diastolic blood pressure is above 90.  You have diabetes, and your systolic blood pressure is over 140 or your diastolic blood pressure is over 90.  You have kidney disease and your blood pressure is above 140/90.  You have heart disease and your blood pressure is above 140/90. Your personal target blood pressure may vary depending on  your medical conditions, your age, and other factors. Follow these instructions at home:  Have your blood pressure rechecked as directed by your health care provider.  Take medicines only as directed by your health care provider. Follow the directions carefully. Blood pressure medicines must be taken as prescribed. The medicine does not work as well when you skip doses. Skipping doses also puts you at risk for problems.  Do not smoke.  Monitor your blood pressure at home as directed by your health care provider. Contact a health care provider if:  You think you are having a reaction to medicines taken.  You have recurrent headaches or feel dizzy.  You have swelling in your ankles.  You have trouble with your vision. Get help right away if:  You develop a severe headache or confusion.  You have  unusual weakness, numbness, or feel faint.  You have severe chest or abdominal pain.  You vomit repeatedly.  You have trouble breathing. This information is not intended to replace advice given to you by your health care provider. Make sure you discuss any questions you have with your health care provider. Document Released: 09/29/2005 Document Revised: 03/06/2016 Document Reviewed: 07/22/2013 Elsevier Interactive Patient Education  2017 [employer]

## 2016-09-11 NOTE — Progress Notes (Signed)
Gregory Gay, is a 50 y.o. male  FIE:[id number]:[id number]  [id number]  DOB - Aug 20, 1966  Chief Complaint  Patient presents with  . Medication Refill  . Back Pain      Subjective:   Gregory Gay is a 50 y.o. male with history of hypertension, hyperlipidemia, severe low back pain and obstructive sleep apnea on (CPAP), here today for a follow up visit and medication refill. Patient is complaining of progressive lower and hip back pain, constant, spasm and discomfort. Does not get relief when taking Ibuprofen. Denies any urinary changes or incontinence. Request Tylenol #3 refill. He is out of his medications for 2 days, needs refill of  Enalapril, HCTZ, and lovastatin. Patient has no other complaint today. Patient has No headache, No chest pain, No abdominal pain - No Nausea, No new weakness tingling or numbness, No Cough - SOB.  Problem  Essential Hypertension   Changed acei to arb 04/19/2014 due to ? Pseudoasthma     ALLERGIES: No Known Allergies  PAST MEDICAL HISTORY: Past Medical History:  Diagnosis Date  . Back pain   . Hyperlipidemia   . Hypertension     MEDICATIONS AT HOME: Prior to Admission medications   Medication Sig Start Date End Date Taking? Authorizing Provider  lovastatin (MEVACOR) 20 MG tablet Take 1 tablet (20 mg total) by mouth every morning. 09/11/16  Yes Quentin Angstlugbemiga E Tolulope Pinkett, MD  acetaminophen-codeine (TYLENOL #3) 300-30 MG tablet Take 1 tablet by mouth every 4 (four) hours as needed. 09/11/16   Quentin Angstlugbemiga E Skylen Danielsen, MD  diclofenac sodium (VOLTAREN) 1 % GEL Apply 4 g topically 4 (four) times daily. 09/11/16   Quentin Angstlugbemiga E Cristofer Yaffe, MD  methocarbamol (ROBAXIN) 500 MG tablet Take 1 tablet (500 mg total) by mouth every 8 (eight) hours as needed for muscle spasms. 09/11/16   Quentin Angstlugbemiga E Brinton Brandel, MD  valsartan (DIOVAN) 80 MG tablet Take 1 tablet (80 mg total) by mouth daily. 09/11/16 11/05/17  Quentin Angstlugbemiga E Roberth Berling, MD    Objective:   Vitals:   09/11/16 1524  BP:  122/79  Pulse: 84  Temp: 97.8 F (36.6 C)  TempSrc: Oral  Weight: 226 lb (102.5 kg)   Exam General appearance : Awake, alert, not in any distress. Speech Clear. Not toxic looking HEENT: Atraumatic and Normocephalic, pupils equally reactive to light and accomodation Neck: Supple, no JVD. No cervical lymphadenopathy.  Chest: Good air entry bilaterally, no added sounds  CVS: S1 S2 regular, no murmurs.  Abdomen: Bowel sounds present, Non tender and not distended with no gaurding, rigidity or rebound. Extremities: B/L Lower Ext shows no edema, both legs are warm to touch Neurology: Awake alert, and oriented X 3, CN II-XII intact, Non focal Skin: No Rash  Data Review Lab Results  Component Value Date   HGBA1C 5.10 11/22/2015    Assessment & Plan   1. Chronic bilateral low back pain without sciatica  - methocarbamol (ROBAXIN) 500 MG tablet; Take 1 tablet (500 mg total) by mouth every 8 (eight) hours as needed for muscle spasms.  Dispense: 60 tablet; Refill: 3 - acetaminophen-codeine (TYLENOL #3) 300-30 MG tablet; Take 1 tablet by mouth every 4 (four) hours as needed.  Dispense: 90 tablet; Refill: 0 - diclofenac sodium (VOLTAREN) 1 % GEL; Apply 4 g topically 4 (four) times daily.  Dispense: 2 Tube; Refill: 1  2. Essential hypertension  - valsartan (DIOVAN) 80 MG tablet; Take 1 tablet (80 mg total) by mouth daily.  Dispense: 90 tablet; Refill: 3 - lovastatin (MEVACOR)  20 MG tablet; Take 1 tablet (20 mg total) by mouth every morning.  Dispense: 90 tablet; Refill: 3  3. OSA (obstructive sleep apnea)  - Cpap titration; Future  Patient have been counseled extensively about nutrition and exercise. Other issues discussed during this visit include: low cholesterol diet, weight control and daily exercise, importance of adherence with medications and regular follow-up. We also discussed long term complications of uncontrolled hypertension.   Return in about 6 months (around 03/11/2017) for  Follow up HTN, Follow up Pain and comorbidities.  The patient was given clear instructions to go to ER or return to medical center if symptoms don't improve, worsen or new problems develop. The patient verbalized understanding. The patient was told to call to get lab results if they haven't heard anything in the next week.   This note has been created with Education officer, environmentalDragon speech recognition software and smart phrase technology. Any transcriptional errors are unintentional.    Jeanann LewandowskyJEGEDE, Sedona Wenk, MD, MHA, FACP, FAAP, CPE [facility] [place], [place] [phone]   09/11/2016, 4:27 PM

## 2016-09-24 ENCOUNTER — Telehealth: Payer: Self-pay | Admitting: Internal Medicine

## 2016-09-24 NOTE — Telephone Encounter (Signed)
Patient lost rx for tylenol 3. Patient would like to know if he's able to get another rx.  Please follow up.

## 2016-09-25 NOTE — Telephone Encounter (Signed)
He needs to show a police report

## 2016-11-10 ENCOUNTER — Ambulatory Visit (HOSPITAL_BASED_OUTPATIENT_CLINIC_OR_DEPARTMENT_OTHER): Payer: No Typology Code available for payment source | Attending: Internal Medicine | Admitting: Internal Medicine

## 2016-11-10 VITALS — Ht 65.0 in | Wt 230.0 lb

## 2016-11-10 DIAGNOSIS — G4733 Obstructive sleep apnea (adult) (pediatric): Secondary | ICD-10-CM

## 2016-11-15 DIAGNOSIS — G4733 Obstructive sleep apnea (adult) (pediatric): Secondary | ICD-10-CM

## 2016-11-15 NOTE — Procedures (Signed)
Patient Name: Gregory Gay, [name] Study Date: 11/10/2016 Gender: Male D.O.B: [birth year] Age (years): 50 Referring Provider: Jeanann Lewandowskylugbemiga Jegede Height (inches): 65 Interpreting Physician: Jetty Duhamellinton Young MD, ABSM Weight (lbs): 230 RPSGT: Ulyess MortSpruill, Vicki BMI: 38 MRN: [id number] Neck Size: 15.00 CLINICAL INFORMATION The patient is referred for a CPAP titration to treat sleep apnea.   Date of NPSG, Split Night or HST: Diagnostic NPSG 03/16/14  AHI 80/ hr, desaturation to 59%, body weight 210 lbs  SLEEP STUDY TECHNIQUE As per the AASM Manual for the Scoring of Sleep and Associated Events v2.3 (April 2016) with a hypopnea requiring 4% desaturations.  The channels recorded and monitored were frontal, central and occipital EEG, electrooculogram (EOG), submentalis EMG (chin), nasal and oral airflow, thoracic and abdominal wall motion, anterior tibialis EMG, snore microphone, electrocardiogram, and pulse oximetry. Continuous positive airway pressure (CPAP) was initiated at the beginning of the study and titrated to treat sleep-disordered breathing.  MEDICATIONS Medications self-administered by patient taken the night of the study : none reported  TECHNICIAN COMMENTS Comments added by technician: PATIENT WAS ORDERED AS A CPAP TITRATION.  Comments added by scorer: N/A  RESPIRATORY PARAMETERS Optimal PAP Pressure (cm): 20 AHI at Optimal Pressure (/hr): 0.0 Overall Minimal O2 (%): 82.00 Supine % at Optimal Pressure (%): 100 Minimal O2 at Optimal Pressure (%): 96.0    SLEEP ARCHITECTURE The study was initiated at 10:08:47 PM and ended at 4:43:21 AM.  Sleep onset time was 1.4 minutes and the sleep efficiency was 96.2%. The total sleep time was 379.5 minutes.  The patient spent 9.88% of the night in stage N1 sleep, 67.98% in stage N2 sleep, 0.00% in stage N3 and 22.13% in REM.Stage REM latency was 12.5 minutes  Wake after sleep onset was 13.7. Alpha intrusion was absent. Supine sleep was  53.12%.  CARDIAC DATA The 2 lead EKG demonstrated sinus rhythm. The mean heart rate was 66.91 beats per minute. Other EKG findings include: PVCs.  LEG MOVEMENT DATA The total Periodic Limb Movements of Sleep (PLMS) were 0. The PLMS index was 0.00. A PLMS index of <15 is considered normal in adults.  IMPRESSIONS - The optimal PAP pressure was 20 cm of water. This is at the top of the pressure range for CPAP and the patient may require BIPAP (e.g. 20/ 17) for comfort. - Central sleep apnea was not noted during this titration (CAI = 0.2/h). - Moderate oxygen desaturations were observed during this titration (min O2 = 82.00%). - The patient snored with Moderate snoring volume during this titration study. - 2-lead EKG demonstrated: PVCs - Clinically significant periodic limb movements were not noted during this study. Arousals associated with PLMs were rare. - Bruxism was noted. DIAGNOSIS - Obstructive Sleep Apnea (327.23 [G47.33 ICD-10])  RECOMMENDATIONS - Trial of CPAP therapy on 20 cm H2O with a Medium size Resmed Full Face Mask AirFit F20 mask and heated humidification. This is at the top of the pressure range for CPAP and the patient may require BIPAP (e.g. 20/ 17) for comfort. - Avoid alcohol, sedatives and other CNS depressants that may worsen sleep apnea and disrupt normal sleep architecture. - Sleep hygiene should be reviewed to assess factors that may improve sleep quality. - Weight management and regular exercise should be initiated or continued.  [Electronically signed] 11/15/2016 12:00 PM  Jetty Duhamellinton Young MD, ABSM Diplomate, American Board of Sleep Medicine   NPI: [id number] Waymon BudgeYOUNG,CLINTON D Diplomate, American Board of Sleep Medicine  ELECTRONICALLY SIGNED ON:  11/15/2016, 11:53 AM [facility] PH: [phone]   FX: [phone] [place]

## 2016-11-18 ENCOUNTER — Telehealth: Payer: Self-pay | Admitting: Internal Medicine

## 2016-11-18 DIAGNOSIS — I10 Essential (primary) hypertension: Secondary | ICD-10-CM

## 2016-11-18 DIAGNOSIS — M545 Low back pain, unspecified: Secondary | ICD-10-CM

## 2016-11-18 DIAGNOSIS — G8929 Other chronic pain: Secondary | ICD-10-CM

## 2016-11-18 MED ORDER — LOVASTATIN 20 MG PO TABS: 20.0000 mg | ORAL_TABLET | Freq: Every morning | ORAL | 0 refills | Status: DC

## 2016-11-18 MED ORDER — VALSARTAN 80 MG PO TABS: 80.0000 mg | ORAL_TABLET | Freq: Every day | ORAL | 0 refills | Status: DC

## 2016-11-18 MED ORDER — METHOCARBAMOL 500 MG PO TABS: 500.0000 mg | ORAL_TABLET | Freq: Three times a day (TID) | ORAL | 0 refills | Status: DC | PRN

## 2016-11-18 NOTE — Telephone Encounter (Signed)
Pt. Came into facility requesting a refill on Tylenol # 3.  °Please f/u  °

## 2016-11-18 NOTE — Telephone Encounter (Signed)
Requested medications refilled 

## 2016-11-18 NOTE — Telephone Encounter (Signed)
Patient called the office to request medication refill for valsartan (DIOVAN) 80 MG tablet, methocarbamol (ROBAXIN) 500 MG tablet, lovastatin (MEVACOR) 20 MG tablet.   Thank you.

## 2016-11-19 ENCOUNTER — Other Ambulatory Visit: Payer: Self-pay | Admitting: Internal Medicine

## 2016-11-19 DIAGNOSIS — M545 Low back pain, unspecified: Secondary | ICD-10-CM

## 2016-11-19 DIAGNOSIS — G8929 Other chronic pain: Secondary | ICD-10-CM

## 2016-11-19 MED ORDER — ACETAMINOPHEN-CODEINE #3 300-30 MG PO TABS: 1.0000 | ORAL_TABLET | ORAL | 0 refills | Status: DC | PRN

## 2016-12-05 ENCOUNTER — Telehealth: Payer: Self-pay | Admitting: Internal Medicine

## 2016-12-05 NOTE — Telephone Encounter (Signed)
Please contact Ms Gregory MilletMegan regarding c-pap machine. States directions are unclear. Please f/u

## 2016-12-08 ENCOUNTER — Telehealth: Payer: Self-pay

## 2016-12-08 NOTE — Telephone Encounter (Signed)
This CM spoke to Dr Hyman HopesJegede regarding ordering a CPAP vs a BiPAP. As per the sleep study impression, the patient may require a  BiPAP for comfort.  Dr Hyman HopesJegede recommended that a BiPAP be ordered.    Call placed to the American Sleep [employer] Association  - CPAP assistance program # [phone] to inquire about the availability of BiPAP machines.  Voicemail message left requesting a call back to # [phone] or [phone]

## 2016-12-08 NOTE — Telephone Encounter (Signed)
Call received from [facility] with [employer] confirming that they have BiPAP machines available through the CPAP assistance program.

## 2016-12-10 ENCOUNTER — Telehealth: Payer: Self-pay

## 2016-12-10 NOTE — Telephone Encounter (Signed)
Call placed to the [employer] # [phone]. Spoke to [employer] who confirmed receipt of the BiPAP application. As per [place]ia Lisbon, CMA, the patient is not able to afford the $100 donation for the machine. This CM paid the donation fee with [facility] [employer].

## 2016-12-10 NOTE — Telephone Encounter (Signed)
Completed application for BiPAP faxed to [employer] CPAP assistance program  - fax # [phone].

## 2016-12-16 ENCOUNTER — Telehealth: Payer: Self-pay

## 2016-12-16 NOTE — Telephone Encounter (Signed)
The CPAP has been delivered.  [facility] and CPAP teaching has been scheduled for 12/25/16 between 0900-1030 [facility].  Call placed to the patient # [phone]  to inform him of the CPAP teaching and a HIPAA compliant voicemail message was left requesting a call back to # [phone] or [phone].

## 2016-12-17 ENCOUNTER — Telehealth: Payer: Self-pay

## 2016-12-17 NOTE — Telephone Encounter (Signed)
Call received from the patient. Informed him that his CPAP has arrived and teaching with the respiratory therapist is scheduled for 12/25/16 @ 1000 [facility].  He was very appreciative of the call.   [facility] and confirmed the teaching for 12/25/16 @ 1000

## 2016-12-17 NOTE — Telephone Encounter (Signed)
Attempted to contact the patient to inform him of the CPAP teaching scheduled for 12/25/16 @ 1000. Call placed to # [phone] (H) and a HIPAA compliant voicemail message was left requesting a call back to # [phone] or [phone].

## 2016-12-25 ENCOUNTER — Telehealth: Payer: Self-pay

## 2016-12-25 NOTE — Telephone Encounter (Signed)
The patient did not arrive for his CPAP teaching today at 1000.  Lysbeth PennerMary Smith, RT with [facility] was here for the 1000 appointment but needed to leave at this time and the appointment will need to be rescheduled. Attempted to contact the patient to reschedule the appointment. Call placed to # [phone] (H) and a HIPAA compliant voicemail message was left requesting a call back to # [phone] or [phone].

## 2016-12-29 ENCOUNTER — Telehealth: Payer: Self-pay

## 2016-12-29 NOTE — Telephone Encounter (Signed)
Attempted to contact the patient to reschedule his appointment for CPAP teaching. Call placed to # [phone] (H) and the person who answered hung up when this CM asked for Gregory Gay. Another call was placed to the same # and a HIPAA compliant voicemail message was left requesting a call back to # [phone] or [phone].

## 2017-01-02 ENCOUNTER — Telehealth: Payer: Self-pay

## 2017-01-02 NOTE — Telephone Encounter (Signed)
This CM met with the patient when he came to the clinic today to check on his CPAP. Informed him that he needs to have a teaching session scheduled with the RT.  Baxter Flattery [facility] Respiratory Therapy, scheduled him an appointment for 01/14/17 @ 1030 for CPAP teaching at [facility].  The appointment information was given to the patient.

## 2017-01-13 ENCOUNTER — Telehealth: Payer: Self-pay

## 2017-01-13 NOTE — Telephone Encounter (Signed)
Call placed to the patient and he confirmed his appointment for CPAP teaching at [facility] tomorrow - 01/14/17 @ 1030

## 2017-01-27 ENCOUNTER — Telehealth: Payer: Self-pay

## 2017-01-27 NOTE — Telephone Encounter (Signed)
Call placed to the patient to discuss the need for CPAP teaching prior to receiving his CPAP machine. He has missed the last 2 teaching appointments and said he would like to schedule another one.  Stressed the importance of keeping the appointment.    An appointment was scheduled for teaching on 02/03/17 @ 0945 with Delice Bison from [facility] Respiratory Therapy department and he was notified of the appointment date/time and said he would be there,

## 2017-02-02 ENCOUNTER — Telehealth: Payer: Self-pay

## 2017-02-02 NOTE — Telephone Encounter (Signed)
Call placed to the patient and confirmed his appointment for CPAP teaching tomorrow, 02/03/17  at [facility].  The teaching is scheduled for 0930, not 0945 as noted in the prior telephone call.

## 2017-02-03 ENCOUNTER — Ambulatory Visit: Payer: Self-pay | Attending: Internal Medicine

## 2017-02-03 ENCOUNTER — Telehealth: Payer: Self-pay

## 2017-02-03 NOTE — Telephone Encounter (Signed)
Patient completed CPAP teaching regarding use/care of the machine with Lysbeth Penner, RT [facility] Respiratory Therapy Department. The patient left with his CPAP machine and mask.

## 2017-02-09 ENCOUNTER — Telehealth: Payer: Self-pay | Admitting: Internal Medicine

## 2017-02-09 NOTE — Telephone Encounter (Signed)
Pt calling to inquire if Dr Hyman Hopes is willing to fill out paperwork to assist pt with applying for disability. States unable to keep employment due to falling asleep at work due to sleep apnea. Please f/u.

## 2017-02-18 NOTE — Telephone Encounter (Signed)
PT called again, he still waiting for your call to talk about his disability, please follow up with PT

## 2017-02-19 NOTE — Telephone Encounter (Signed)
Please schedule the patient an appointment to discuss the concern and need.

## 2017-04-22 ENCOUNTER — Ambulatory Visit: Payer: No Typology Code available for payment source | Admitting: Internal Medicine

## 2017-06-17 ENCOUNTER — Ambulatory Visit: Payer: No Typology Code available for payment source | Admitting: Internal Medicine

## 2017-06-22 ENCOUNTER — Ambulatory Visit: Payer: Self-pay | Attending: Internal Medicine | Admitting: Internal Medicine

## 2017-06-22 ENCOUNTER — Encounter: Payer: Self-pay | Admitting: Internal Medicine

## 2017-06-22 VITALS — BP 162/107 | HR 69 | Temp 98.4°F | Resp 16 | Ht 67.0 in | Wt 224.4 lb

## 2017-06-22 DIAGNOSIS — M25551 Pain in right hip: Secondary | ICD-10-CM | POA: Insufficient documentation

## 2017-06-22 DIAGNOSIS — M545 Low back pain: Secondary | ICD-10-CM | POA: Insufficient documentation

## 2017-06-22 DIAGNOSIS — Z79899 Other long term (current) drug therapy: Secondary | ICD-10-CM | POA: Insufficient documentation

## 2017-06-22 DIAGNOSIS — Z1211 Encounter for screening for malignant neoplasm of colon: Secondary | ICD-10-CM | POA: Insufficient documentation

## 2017-06-22 DIAGNOSIS — G8929 Other chronic pain: Secondary | ICD-10-CM | POA: Insufficient documentation

## 2017-06-22 DIAGNOSIS — E785 Hyperlipidemia, unspecified: Secondary | ICD-10-CM | POA: Insufficient documentation

## 2017-06-22 DIAGNOSIS — G4733 Obstructive sleep apnea (adult) (pediatric): Secondary | ICD-10-CM | POA: Insufficient documentation

## 2017-06-22 DIAGNOSIS — I10 Essential (primary) hypertension: Secondary | ICD-10-CM | POA: Insufficient documentation

## 2017-06-22 LAB — POCT GLYCOSYLATED HEMOGLOBIN (HGB A1C): Hemoglobin A1C: 4.8

## 2017-06-22 MED ORDER — DICLOFENAC SODIUM 1 % TD GEL: 4.0000 g | Freq: Four times a day (QID) | TRANSDERMAL | 1 refills | Status: DC

## 2017-06-22 MED ORDER — HYDROCHLOROTHIAZIDE 25 MG PO TABS: 25.0000 mg | ORAL_TABLET | Freq: Every day | ORAL | 3 refills | Status: DC

## 2017-06-22 MED ORDER — LOVASTATIN 20 MG PO TABS
20.0000 mg | ORAL_TABLET | Freq: Every morning | ORAL | 3 refills | Status: DC
Start: 2017-06-22 — End: 2017-06-22

## 2017-06-22 MED ORDER — MELOXICAM 15 MG PO TABS: 15.0000 mg | ORAL_TABLET | Freq: Every day | ORAL | 3 refills | Status: DC

## 2017-06-22 MED ORDER — LOVASTATIN 20 MG PO TABS: 20.0000 mg | ORAL_TABLET | Freq: Every morning | ORAL | 3 refills | Status: DC

## 2017-06-22 MED ORDER — METHOCARBAMOL 500 MG PO TABS: 500.0000 mg | ORAL_TABLET | Freq: Three times a day (TID) | ORAL | 1 refills | Status: DC | PRN

## 2017-06-22 MED ORDER — VALSARTAN 80 MG PO TABS: 80.0000 mg | ORAL_TABLET | Freq: Every day | ORAL | 3 refills | Status: DC

## 2017-06-22 MED ORDER — ACETAMINOPHEN-CODEINE #3 300-30 MG PO TABS: 1.0000 | ORAL_TABLET | ORAL | 0 refills | Status: DC | PRN

## 2017-06-22 NOTE — Patient Instructions (Signed)
Back Pain, Adult Back pain is very common in adults.The cause of back pain is rarely dangerous and the pain often gets better over time.The cause of your back pain may not be known. Some common causes of back pain include:  Strain of the muscles or ligaments supporting the spine.  Wear and tear (degeneration) of the spinal disks.  Arthritis.  Direct injury to the back.  For many people, back pain may return. Since back pain is rarely dangerous, most people can learn to manage this condition on their own. Follow these instructions at home: Watch your back pain for any changes. The following actions may help to lessen any discomfort you are feeling:  Remain active. It is stressful on your back to sit or stand in one place for long periods of time. Do not sit, drive, or stand in one place for more than 30 minutes at a time. Take short walks on even surfaces as soon as you are able.Try to increase the length of time you walk each day.  Exercise regularly as directed by your health care provider. Exercise helps your back heal faster. It also helps avoid future injury by keeping your muscles strong and flexible.  Do not stay in bed.Resting more than 1-2 days can delay your recovery.  Pay attention to your body when you bend and lift. The most comfortable positions are those that put less stress on your recovering back. Always use proper lifting techniques, including: ? Bending your knees. ? Keeping the load close to your body. ? Avoiding twisting.  Find a comfortable position to sleep. Use a firm mattress and lie on your side with your knees slightly bent. If you lie on your back, put a pillow under your knees.  Avoid feeling anxious or stressed.Stress increases muscle tension and can worsen back pain.It is important to recognize when you are anxious or stressed and learn ways to manage it, such as with exercise.  Take medicines only as directed by your health care provider.  Over-the-counter medicines to reduce pain and inflammation are often the most helpful.Your health care provider may prescribe muscle relaxant drugs.These medicines help dull your pain so you can more quickly return to your normal activities and healthy exercise.  Apply ice to the injured area: ? Put ice in a plastic bag. ? Place a towel between your skin and the bag. ? Leave the ice on for 20 minutes, 2-3 times a day for the first 2-3 days. After that, ice and heat may be alternated to reduce pain and spasms.  Maintain a healthy weight. Excess weight puts extra stress on your back and makes it difficult to maintain good posture.  Contact a health care provider if:  You have pain that is not relieved with rest or medicine.  You have increasing pain going down into the legs or buttocks.  You have pain that does not improve in one week.  You have night pain.  You lose weight.  You have a fever or chills. Get help right away if:  You develop new bowel or bladder control problems.  You have unusual weakness or numbness in your arms or legs.  You develop nausea or vomiting.  You develop abdominal pain.  You feel faint. This information is not intended to replace advice given to you by your health care provider. Make sure you discuss any questions you have with your health care provider. Document Released: 09/29/2005 Document Revised: 02/07/2016 Document Reviewed: 01/31/2014 Elsevier Interactive Patient Education    2017 [employer] DASH Eating Plan DASH stands for "Dietary Approaches to Stop Hypertension." The DASH eating plan is a healthy eating plan that has been shown to reduce high blood pressure (hypertension). It may also reduce your risk for type 2 diabetes, heart disease, and stroke. The DASH eating plan may also help with weight loss. What are tips for following this plan? General guidelines  Avoid eating more than 2,300 mg (milligrams) of salt (sodium) a day. If you  have hypertension, you may need to reduce your sodium intake to 1,500 mg a day.  Limit alcohol intake to no more than 1 drink a day for nonpregnant women and 2 drinks a day for men. One drink equals 12 oz of beer, 5 oz of wine, or 1 oz of hard liquor.  Work with your health care provider to maintain a healthy body weight or to lose weight. Ask what an ideal weight is for you.  Get at least 30 minutes of exercise that causes your heart to beat faster (aerobic exercise) most days of the week. Activities may include walking, swimming, or biking.  Work with your health care provider or diet and nutrition specialist (dietitian) to adjust your eating plan to your individual calorie needs. Reading food labels  Check food labels for the amount of sodium per serving. Choose foods with less than 5 percent of the Daily Value of sodium. Generally, foods with less than 300 mg of sodium per serving fit into this eating plan.  To find whole grains, look for the word "whole" as the first word in the ingredient list. Shopping  Buy products labeled as "low-sodium" or "no salt added."  Buy fresh foods. Avoid canned foods and premade or frozen meals. Cooking  Avoid adding salt when cooking. Use salt-free seasonings or herbs instead of table salt or sea salt. Check with your health care provider or pharmacist before using salt substitutes.  Do not fry foods. Cook foods using healthy methods such as baking, boiling, grilling, and broiling instead.  Cook with heart-healthy oils, such as olive, canola, soybean, or sunflower oil. Meal planning   Eat a balanced diet that includes: ? 5 or more servings of fruits and vegetables each day. At each meal, try to fill half of your plate with fruits and vegetables. ? Up to 6-8 servings of whole grains each day. ? Less than 6 oz of lean meat, poultry, or fish each day. A 3-oz serving of meat is about the same size as a deck of cards. One egg equals 1 oz. ? 2 servings  of low-fat dairy each day. ? A serving of nuts, seeds, or beans 5 times each week. ? Heart-healthy fats. Healthy fats called Omega-3 fatty acids are found in foods such as flaxseeds and coldwater fish, like sardines, salmon, and mackerel.  Limit how much you eat of the following: ? Canned or prepackaged foods. ? Food that is high in trans fat, such as fried foods. ? Food that is high in saturated fat, such as fatty meat. ? Sweets, desserts, sugary drinks, and other foods with added sugar. ? Full-fat dairy products.  Do not salt foods before eating.  Try to eat at least 2 vegetarian meals each week.  Eat more home-cooked food and less restaurant, buffet, and fast food.  When eating at a restaurant, ask that your food be prepared with less salt or no salt, if possible. What foods are recommended? The items listed may not be a complete list. Talk with  your dietitian about what dietary choices are best for you. Grains Whole-grain or whole-wheat bread. Whole-grain or whole-wheat pasta. Brown rice. Orpah Cobb. Bulgur. Whole-grain and low-sodium cereals. Pita bread. Low-fat, low-sodium crackers. Whole-wheat flour tortillas. Vegetables Fresh or frozen vegetables (raw, steamed, roasted, or grilled). Low-sodium or reduced-sodium tomato and vegetable juice. Low-sodium or reduced-sodium tomato sauce and tomato paste. Low-sodium or reduced-sodium canned vegetables. Fruits All fresh, dried, or frozen fruit. Canned fruit in natural juice (without added sugar). Meat and other protein foods Skinless chicken or [place]. Ground chicken or [place]. Pork with fat trimmed off. Fish and seafood. Egg whites. Dried beans, peas, or lentils. Unsalted nuts, nut butters, and seeds. Unsalted canned beans. Lean cuts of beef with fat trimmed off. Low-sodium, lean deli meat. Dairy Low-fat (1%) or fat-free (skim) milk. Fat-free, low-fat, or reduced-fat cheeses. Nonfat, low-sodium ricotta or cottage cheese. Low-fat or  nonfat yogurt. Low-fat, low-sodium cheese. Fats and oils Soft margarine without trans fats. Vegetable oil. Low-fat, reduced-fat, or light mayonnaise and salad dressings (reduced-sodium). Canola, safflower, olive, soybean, and sunflower oils. Avocado. Seasoning and other foods Herbs. Spices. Seasoning mixes without salt. Unsalted popcorn and pretzels. Fat-free sweets. What foods are not recommended? The items listed may not be a complete list. Talk with your dietitian about what dietary choices are best for you. Grains Baked goods made with fat, such as croissants, muffins, or some breads. Dry pasta or rice meal packs. Vegetables Creamed or fried vegetables. Vegetables in a cheese sauce. Regular canned vegetables (not low-sodium or reduced-sodium). Regular canned tomato sauce and paste (not low-sodium or reduced-sodium). Regular tomato and vegetable juice (not low-sodium or reduced-sodium). Rosita Fire. Olives. Fruits Canned fruit in a light or heavy syrup. Fried fruit. Fruit in cream or butter sauce. Meat and other protein foods Fatty cuts of meat. Ribs. Fried meat. Tomasa Blase. Sausage. Bologna and other processed lunch meats. Salami. Fatback. Hotdogs. Bratwurst. Salted nuts and seeds. Canned beans with added salt. Canned or smoked fish. Whole eggs or egg yolks. Chicken or [place] with skin. Dairy Whole or 2% milk, cream, and half-and-half. Whole or full-fat cream cheese. Whole-fat or sweetened yogurt. Full-fat cheese. Nondairy creamers. Whipped toppings. Processed cheese and cheese spreads. Fats and oils Butter. Stick margarine. Lard. Shortening. Ghee. Bacon fat. Tropical oils, such as coconut, palm kernel, or palm oil. Seasoning and other foods Salted popcorn and pretzels. Onion salt, garlic salt, seasoned salt, table salt, and sea salt. Worcestershire sauce. Tartar sauce. Barbecue sauce. Teriyaki sauce. Soy sauce, including reduced-sodium. Steak sauce. Canned and packaged gravies. Fish sauce. Oyster  sauce. Cocktail sauce. Horseradish that you find on the shelf. Ketchup. Mustard. Meat flavorings and tenderizers. Bouillon cubes. Hot sauce and Tabasco sauce. Premade or packaged marinades. Premade or packaged taco seasonings. Relishes. Regular salad dressings. Where to find more information:  National Heart, Lung, and Blood Institute: PopSteam.is  American Heart Association: www.heart.org Summary  The DASH eating plan is a healthy eating plan that has been shown to reduce high blood pressure (hypertension). It may also reduce your risk for type 2 diabetes, heart disease, and stroke.  With the DASH eating plan, you should limit salt (sodium) intake to 2,300 mg a day. If you have hypertension, you may need to reduce your sodium intake to 1,500 mg a day.  When on the DASH eating plan, aim to eat more fresh fruits and vegetables, whole grains, lean proteins, low-fat dairy, and heart-healthy fats.  Work with your health care provider or diet and nutrition specialist (dietitian) to adjust your eating  plan to your individual calorie needs. This information is not intended to replace advice given to you by your health care provider. Make sure you discuss any questions you have with your health care provider. Document Released: 09/18/2011 Document Revised: 09/22/2016 Document Reviewed: 09/22/2016 Elsevier Interactive Patient Education  2017 Elsevier Inc. Hypertension Hypertension, commonly called high blood pressure, is when the force of blood pumping through the arteries is too strong. The arteries are the blood vessels that carry blood from the heart throughout the body. Hypertension forces the heart to work harder to pump blood and may cause arteries to become narrow or stiff. Having untreated or uncontrolled hypertension can cause heart attacks, strokes, kidney disease, and other problems. A blood pressure reading consists of a higher number over a lower number. Ideally, your blood pressure  should be below 120/80. The first ("top") number is called the systolic pressure. It is a measure of the pressure in your arteries as your heart beats. The second ("bottom") number is called the diastolic pressure. It is a measure of the pressure in your arteries as the heart relaxes. What are the causes? The cause of this condition is not known. What increases the risk? Some risk factors for high blood pressure are under your control. Others are not. Factors you can change  Smoking.  Having type 2 diabetes mellitus, high cholesterol, or both.  Not getting enough exercise or physical activity.  Being overweight.  Having too much fat, sugar, calories, or salt (sodium) in your diet.  Drinking too much alcohol. Factors that are difficult or impossible to change  Having chronic kidney disease.  Having a family history of high blood pressure.  Age. Risk increases with age.  Race. You may be at higher risk if you are African-American.  Gender. Men are at higher risk than women before age 86. After age 42, women are at higher risk than men.  Having obstructive sleep apnea.  Stress. What are the signs or symptoms? Extremely high blood pressure (hypertensive crisis) may cause:  Headache.  Anxiety.  Shortness of breath.  Nosebleed.  Nausea and vomiting.  Severe chest pain.  Jerky movements you cannot control (seizures).  How is this diagnosed? This condition is diagnosed by measuring your blood pressure while you are seated, with your arm resting on a surface. The cuff of the blood pressure monitor will be placed directly against the skin of your upper arm at the level of your heart. It should be measured at least twice using the same arm. Certain conditions can cause a difference in blood pressure between your right and left arms. Certain factors can cause blood pressure readings to be lower or higher than normal (elevated) for a short period of time:  When your blood  pressure is higher when you are in a health care provider's office than when you are at home, this is called white coat hypertension. Most people with this condition do not need medicines.  When your blood pressure is higher at home than when you are in a health care provider's office, this is called masked hypertension. Most people with this condition may need medicines to control blood pressure.  If you have a high blood pressure reading during one visit or you have normal blood pressure with other risk factors:  You may be asked to return on a different day to have your blood pressure checked again.  You may be asked to monitor your blood pressure at home for 1 week or longer.  If you are diagnosed with hypertension, you may have other blood or imaging tests to help your health care provider understand your overall risk for other conditions. How is this treated? This condition is treated by making healthy lifestyle changes, such as eating healthy foods, exercising more, and reducing your alcohol intake. Your health care provider may prescribe medicine if lifestyle changes are not enough to get your blood pressure under control, and if:  Your systolic blood pressure is above 130.  Your diastolic blood pressure is above 80.  Your personal target blood pressure may vary depending on your medical conditions, your age, and other factors. Follow these instructions at home: Eating and drinking  Eat a diet that is high in fiber and potassium, and low in sodium, added sugar, and fat. An example eating plan is called the DASH (Dietary Approaches to Stop Hypertension) diet. To eat this way: ? Eat plenty of fresh fruits and vegetables. Try to fill half of your plate at each meal with fruits and vegetables. ? Eat whole grains, such as whole wheat pasta, brown rice, or whole grain bread. Fill about one quarter of your plate with whole grains. ? Eat or drink low-fat dairy products, such as skim milk or  low-fat yogurt. ? Avoid fatty cuts of meat, processed or cured meats, and poultry with skin. Fill about one quarter of your plate with lean proteins, such as fish, chicken without skin, beans, eggs, and tofu. ? Avoid premade and processed foods. These tend to be higher in sodium, added sugar, and fat.  Reduce your daily sodium intake. Most people with hypertension should eat less than 1,500 mg of sodium a day.  Limit alcohol intake to no more than 1 drink a day for nonpregnant women and 2 drinks a day for men. One drink equals 12 oz of beer, 5 oz of wine, or 1 oz of hard liquor. Lifestyle  Work with your health care provider to maintain a healthy body weight or to lose weight. Ask what an ideal weight is for you.  Get at least 30 minutes of exercise that causes your heart to beat faster (aerobic exercise) most days of the week. Activities may include walking, swimming, or biking.  Include exercise to strengthen your muscles (resistance exercise), such as pilates or lifting weights, as part of your weekly exercise routine. Try to do these types of exercises for 30 minutes at least 3 days a week.  Do not use any products that contain nicotine or tobacco, such as cigarettes and e-cigarettes. If you need help quitting, ask your health care provider.  Monitor your blood pressure at home as told by your health care provider.  Keep all follow-up visits as told by your health care provider. This is important. Medicines  Take over-the-counter and prescription medicines only as told by your health care provider. Follow directions carefully. Blood pressure medicines must be taken as prescribed.  Do not skip doses of blood pressure medicine. Doing this puts you at risk for problems and can make the medicine less effective.  Ask your health care provider about side effects or reactions to medicines that you should watch for. Contact a health care provider if:  You think you are having a reaction to  a medicine you are taking.  You have headaches that keep coming back (recurring).  You feel dizzy.  You have swelling in your ankles.  You have trouble with your vision. Get help right away if:  You develop a severe headache  or confusion.  You have unusual weakness or numbness.  You feel faint.  You have severe pain in your chest or abdomen.  You vomit repeatedly.  You have trouble breathing. Summary  Hypertension is when the force of blood pumping through your arteries is too strong. If this condition is not controlled, it may put you at risk for serious complications.  Your personal target blood pressure may vary depending on your medical conditions, your age, and other factors. For most people, a normal blood pressure is less than 120/80.  Hypertension is treated with lifestyle changes, medicines, or a combination of both. Lifestyle changes include weight loss, eating a healthy, low-sodium diet, exercising more, and limiting alcohol. This information is not intended to replace advice given to you by your health care provider. Make sure you discuss any questions you have with your health care provider. Document Released: 09/29/2005 Document Revised: 08/27/2016 Document Reviewed: 08/27/2016 Elsevier Interactive Patient Education  [employer]2018 Elsevier Inc.

## 2017-06-22 NOTE — Progress Notes (Signed)
Gregory Gay, is a 51 y.o. male  GGY:[id number]  [id number]  DOB - 1965-11-08  Chief Complaint  Patient presents with  . Back Pain  . Knee Pain       Subjective:   Gregory Gay is a 51 y.o. male with history of hypertension, hyperlipidemia, severe low back pain and obstructive sleep apnea on (CPAP), here today for a follow up visit and medication refill. Patient is complaining of ongoing lowerback and right hip pain, constant, spasmic and discomfort. Does not get relief when taking Ibuprofen. Denies any urinary changes or incontinence. He takes some of his mother's Meloxicam with some relief. Request Tylenol #3 refill. He is out of his blood pressure medications for few days, needs refill of Valsartan, HCTZ, and lovastatin. Patient has no other complaint today here today for a follow up visit. Patient has No headache, No chest pain, No abdominal pain - No Nausea, No new weakness tingling or numbness, No Cough - SOB.  No problems updated.  ALLERGIES: No Known Allergies  PAST MEDICAL HISTORY: Past Medical History:  Diagnosis Date  . Back pain   . Hyperlipidemia   . Hypertension     MEDICATIONS AT HOME: Prior to Admission medications   Medication Sig Start Date End Date Taking? Authorizing Provider  acetaminophen-codeine (TYLENOL #3) 300-30 MG tablet Take 1 tablet by mouth every 4 (four) hours as needed. 06/22/17  Yes Tresa Garter, MD  diclofenac sodium (VOLTAREN) 1 % GEL Apply 4 g topically 4 (four) times daily. 06/22/17  Yes Tresa Garter, MD  lovastatin (MEVACOR) 20 MG tablet Take 1 tablet (20 mg total) by mouth every morning. 06/22/17  Yes Tresa Garter, MD  methocarbamol (ROBAXIN) 500 MG tablet Take 1 tablet (500 mg total) by mouth every 8 (eight) hours as needed for muscle spasms. 06/22/17  Yes Tresa Garter, MD  valsartan (DIOVAN) 80 MG tablet Take 1 tablet (80 mg total) by mouth daily. 06/22/17 08/16/18 Yes Tresa Garter, MD    hydrochlorothiazide (HYDRODIURIL) 25 MG tablet Take 1 tablet (25 mg total) by mouth daily. 06/22/17   Tresa Garter, MD    Objective:   Vitals:   06/22/17 0924 06/22/17 0952  BP: (!) 169/108 (!) 162/107  Pulse: 69   Resp: 16   Temp: 98.4 F (36.9 C)   SpO2: 96%   Weight: 224 lb 6.4 oz (101.8 kg)   Height: '5\' 7"'  (1.702 m)    Exam General appearance : Awake, alert, not in any distress. Speech Clear. Not toxic looking HEENT: Atraumatic and Normocephalic, pupils equally reactive to light and accomodation Neck: Supple, no JVD. No cervical lymphadenopathy.  Chest: Good air entry bilaterally, no added sounds  CVS: S1 S2 regular, no murmurs.  Abdomen: Bowel sounds present, Non tender and not distended with no gaurding, rigidity or rebound. Extremities: B/L Lower Ext shows no edema, both legs are warm to touch Neurology: Awake alert, and oriented X 3, CN II-XII intact, Non focal Skin: No Rash  Data Review Lab Results  Component Value Date   HGBA1C 5.10 11/22/2015    Assessment & Plan   1. Essential hypertension  - CMP14+EGFR - POCT glycosylated hemoglobin (Hb A1C)  - valsartan (DIOVAN) 80 MG tablet; Take 1 tablet (80 mg total) by mouth daily.  Dispense: 90 tablet; Refill: 3  We have discussed target BP range and blood pressure goal. I have advised patient to check BP regularly and to call us back or report to clinic if  the numbers are consistently higher than 140/90. We discussed the importance of compliance with medical therapy and DASH diet recommended, consequences of uncontrolled hypertension discussed.  - continue current BP medications  2. Chronic bilateral low back pain without sciatica  - methocarbamol (ROBAXIN) 500 MG tablet; Take 1 tablet (500 mg total) by mouth every 8 (eight) hours as needed for muscle spasms.  Dispense: 60 tablet; Refill: 1 - acetaminophen-codeine (TYLENOL #3) 300-30 MG tablet; Take 1 tablet by mouth every 4 (four) hours as needed.   Dispense: 90 tablet; Refill: 0 - diclofenac sodium (VOLTAREN) 1 % GEL; Apply 4 g topically 4 (four) times daily.  Dispense: 2 Tube; Refill: 1  3. OSA (obstructive sleep apnea)  - Continue CPAP  4. Dyslipidemia  - lovastatin (MEVACOR) 20 MG tablet; Take 1 tablet (20 mg total) by mouth every morning.  Dispense: 90 tablet; Refill: 3  - Lipid panel  To address this please limit saturated fat to no more than 7% of your calories, limit cholesterol to 200 mg/day, increase fiber and exercise as tolerated. If needed we may add another cholesterol lowering medication to your regimen.   5. Colon cancer screening  - Ambulatory referral to Gastroenterology  Patient have been counseled extensively about nutrition and exercise. Other issues discussed during this visit include: low cholesterol diet, weight control and daily exercise, importance of adherence with medications and regular follow-up. We also discussed long term complications of uncontrolled hypertension.   Return in about 4 weeks (around 07/20/2017) for CPP [facility] for BP/CBG and DM management.  The patient was given clear instructions to go to ER or return to medical center if symptoms don't improve, worsen or new problems develop. The patient verbalized understanding. The patient was told to call to get lab results if they haven't heard anything in the next week.   This note has been created with Surveyor, quantity. Any transcriptional errors are unintentional.    Angelica Chessman, MD, [place], Karilyn Cota, [place] and [place], [place]   06/22/2017, 10:38 AM

## 2017-06-23 LAB — CMP14+EGFR
ALK PHOS: 60 IU/L (ref 39–117)
ALT: 18 IU/L (ref 0–44)
AST: 16 IU/L (ref 0–40)
Albumin/Globulin Ratio: 2 (ref 1.2–2.2)
Albumin: 4.5 g/dL (ref 3.5–5.5)
BILIRUBIN TOTAL: 0.8 mg/dL (ref 0.0–1.2)
BUN/Creatinine Ratio: 14 (ref 9–20)
BUN: 11 mg/dL (ref 6–24)
CHLORIDE: 102 mmol/L (ref 96–106)
CO2: 25 mmol/L (ref 20–29)
CREATININE: 0.8 mg/dL (ref 0.76–1.27)
Calcium: 9.2 mg/dL (ref 8.7–10.2)
GFR calc Af Amer: 120 mL/min/{1.73_m2} (ref >59)
GFR calc non Af Amer: 103 mL/min/{1.73_m2} (ref >59)
GLUCOSE: 90 mg/dL (ref 65–99)
Globulin, Total: 2.2 g/dL (ref 1.5–4.5)
Potassium: 3.8 mmol/L (ref 3.5–5.2)
Sodium: 142 mmol/L (ref 134–144)
Total Protein: 6.7 g/dL (ref 6.0–8.5)

## 2017-06-23 LAB — LIPID PANEL
CHOLESTEROL TOTAL: 219 mg/dL — AB (ref 100–199)
Chol/HDL Ratio: 4.1 ratio (ref 0.0–5.0)
HDL: 53 mg/dL (ref >39)
LDL CALC: 142 mg/dL — AB (ref 0–99)
TRIGLYCERIDES: 122 mg/dL (ref 0–149)
VLDL CHOLESTEROL CAL: 24 mg/dL (ref 5–40)

## 2017-06-29 ENCOUNTER — Ambulatory Visit: Payer: Self-pay

## 2017-06-29 ENCOUNTER — Telehealth: Payer: Self-pay | Admitting: *Deleted

## 2017-06-29 NOTE — Telephone Encounter (Signed)
MA unable to reach patient due to phone picking up and noise being present in the back ground with no greeting.  !!!Please inform patient of cholesterol level being high and needing to continue with cholesterol medication. Patient is also advised to limit saturated fat intake and increase fiber and exercise!!!

## 2017-06-29 NOTE — Telephone Encounter (Signed)
-----   Message from Quentin Angst, MD sent at 06/26/2017 10:27 AM EDT ----- Labs results are normal except for the cholesterol level, please continue your cholesterol medication and please limit saturated fat to no more than 7% of your calories, limit cholesterol to 200 mg/day, increase fiber and exercise as tolerated. If needed we may add another cholesterol lowering medication to your regimen.

## 2017-07-21 ENCOUNTER — Ambulatory Visit: Payer: Self-pay | Admitting: Pharmacist

## 2017-07-21 NOTE — Progress Notes (Deleted)
   S:    Patient arrives in good spirits.    Presents to the clinic for hypertension evaluation. Patient was referred on 06/22/17 by Dr. Hyman Hopes.  Patient was last seen by Primary Care Provider on 06/22/17.   Patient {Actions; denies-reports:120008} adherence with medications.  Current BP Medications include:  Valsartan 80 mg daily, HCTZ 25 mg daily  Antihypertensives tried in the past include: ***  Dietary habits include:   O:   Last 3 Office BP readings: BP Readings from Last 3 Encounters:  06/22/17 (!) 162/107  09/11/16 122/79  11/22/15 122/75    BMET    Component Value Date/Time   NA 142 06/22/2017 1059   K 3.8 06/22/2017 1059   CL 102 06/22/2017 1059   CO2 25 06/22/2017 1059   GLUCOSE 90 06/22/2017 1059   GLUCOSE 79 11/22/2015 1722   BUN 11 06/22/2017 1059   CREATININE 0.80 06/22/2017 1059   CREATININE 0.89 11/22/2015 1722   CALCIUM 9.2 06/22/2017 1059   GFRNONAA 103 06/22/2017 1059   GFRNONAA >89 11/22/2015 1722   GFRAA 120 06/22/2017 1059   GFRAA >89 11/22/2015 1722    A/P: Hypertension longstanding/newly diagnosed currently *** on current medications.  {Meds adjust:18428} ***.   Results reviewed and written information provided.   Total time in face-to-face counseling *** minutes.   F/U Clinic Visit with Dr. Marland Kitchen  Patient seen with Theodoro Kos, PharmD Candidate

## 2017-07-22 ENCOUNTER — Encounter (HOSPITAL_COMMUNITY): Payer: Self-pay

## 2017-07-22 ENCOUNTER — Emergency Department (HOSPITAL_COMMUNITY)
Admission: EM | Admit: 2017-07-22 | Discharge: 2017-07-22 | Disposition: A | Discharge status: Home / Self Care | Payer: Self-pay | Attending: Emergency Medicine | Admitting: Emergency Medicine

## 2017-07-22 DIAGNOSIS — Z711 Person with feared health complaint in whom no diagnosis is made: Secondary | ICD-10-CM | POA: Insufficient documentation

## 2017-07-22 DIAGNOSIS — R319 Hematuria, unspecified: Secondary | ICD-10-CM | POA: Insufficient documentation

## 2017-07-22 DIAGNOSIS — I10 Essential (primary) hypertension: Secondary | ICD-10-CM | POA: Insufficient documentation

## 2017-07-22 DIAGNOSIS — M545 Low back pain: Secondary | ICD-10-CM

## 2017-07-22 DIAGNOSIS — G8929 Other chronic pain: Secondary | ICD-10-CM | POA: Insufficient documentation

## 2017-07-22 DIAGNOSIS — F17210 Nicotine dependence, cigarettes, uncomplicated: Secondary | ICD-10-CM | POA: Insufficient documentation

## 2017-07-22 LAB — URINALYSIS, ROUTINE W REFLEX MICROSCOPIC
BILIRUBIN URINE: NEGATIVE
Bacteria, UA: NONE SEEN
GLUCOSE, UA: NEGATIVE mg/dL
KETONES UR: NEGATIVE mg/dL
LEUKOCYTES UA: NEGATIVE
Nitrite: NEGATIVE
PH: 6 (ref 5.0–8.0)
Protein, ur: NEGATIVE mg/dL
Specific Gravity, Urine: 1.023 (ref 1.005–1.030)

## 2017-07-22 MED ORDER — CEFTRIAXONE SODIUM 250 MG IJ SOLR
250.0000 mg | Freq: Once | INTRAMUSCULAR | Status: AC
  Administered 2017-07-22: 250 mg via INTRAMUSCULAR
  Filled 2017-07-22: qty 250

## 2017-07-22 MED ORDER — LIDOCAINE HCL (PF) 1 % IJ SOLN
1.8000 mL | Freq: Once | INTRAMUSCULAR | Status: AC
  Administered 2017-07-22: 1.8 mL
  Filled 2017-07-22: qty 5

## 2017-07-22 MED ORDER — METRONIDAZOLE 500 MG PO TABS
2000.0000 mg | ORAL_TABLET | Freq: Once | ORAL | Status: AC
  Administered 2017-07-22: 2000 mg via ORAL
  Filled 2017-07-22: qty 4

## 2017-07-22 MED ORDER — AZITHROMYCIN 250 MG PO TABS
1000.0000 mg | ORAL_TABLET | Freq: Once | ORAL | Status: AC
  Administered 2017-07-22: 1000 mg via ORAL
  Filled 2017-07-22: qty 4

## 2017-07-22 NOTE — ED Provider Notes (Signed)
[facility]-EMERGENCY DEPT Provider Note   CSN: [id number] Arrival date & time: 07/22/17  0820   History   Chief Complaint Chief Complaint  Patient presents with  . Back Pain    HPI Gregory Gay is a 51 y.o. male.  HPI   As a past medical history of chronic back pain, hyperlipidemia and hypertension. He says his wife is currently pregnant and tested positive for STD. He is not sure which one it was but thinks it was trichomoniasis. He has been having hematuria last 2 days, denies having had this before. He denies having any new or different back pain than his usual chronic back pain and is not asking to be seen for that at this time.  He denies having any hesitancy him a dysuria, retention, fevers, lower abdominal pain or a new kind of back pain.  Past Medical History:  Diagnosis Date  . Back pain   . Hyperlipidemia   . Hypertension     Patient Active Problem List   Diagnosis Date Noted  . Dyslipidemia 06/22/2017  . Colon cancer screening 06/22/2017  . Upper back pain on right side 12/14/2014  . Dyspnea 04/19/2014  . OSA (obstructive sleep apnea) 02/27/2014  . Essential hypertension 06/03/2013  . Low back pain 06/03/2013  . Smoker 06/03/2013    Past Surgical History:  Procedure Laterality Date  . EYE SURGERY         Home Medications    Prior to Admission medications   Medication Sig Start Date End Date Taking? Authorizing Provider  acetaminophen-codeine (TYLENOL #3) 300-30 MG tablet Take 1 tablet by mouth every 4 (four) hours as needed. 06/22/17   Quentin Angst, MD  diclofenac sodium (VOLTAREN) 1 % GEL Apply 4 g topically 4 (four) times daily. 06/22/17   Quentin Angst, MD  hydrochlorothiazide (HYDRODIURIL) 25 MG tablet Take 1 tablet (25 mg total) by mouth daily. 06/22/17   Quentin Angst, MD  lovastatin (MEVACOR) 20 MG tablet Take 1 tablet (20 mg total) by mouth every morning. 06/22/17   Quentin Angst, MD  meloxicam (MOBIC) 15 MG tablet  Take 1 tablet (15 mg total) by mouth daily. 06/22/17   Quentin Angst, MD  methocarbamol (ROBAXIN) 500 MG tablet Take 1 tablet (500 mg total) by mouth every 8 (eight) hours as needed for muscle spasms. 06/22/17   Quentin Angst, MD  valsartan (DIOVAN) 80 MG tablet Take 1 tablet (80 mg total) by mouth daily. 06/22/17 08/16/18  Quentin Angst, MD   Family History No family history on file.  Social History Social History  Substance Use Topics  . Smoking status: Current Every Day Smoker    Packs/day: 0.25    Years: 12.00    Types: Cigarettes  . Smokeless tobacco: Never Used  . Alcohol use No     Allergies   Patient has no known allergies.  Review of Systems Review of Systems Negative ROS aside from pertinent positives and negatives as listed in HPI  Physical Exam Updated Vital Signs BP (!) 161/103   Pulse 80   Temp 98.5 F (36.9 C) (Oral)   Resp 18   SpO2 96%   Physical Exam  Constitutional: He is oriented to person, place, and time. He appears well-developed and well-nourished. No distress.  HENT:  Head: Normocephalic and atraumatic.  Eyes: Pupils are equal, round, and reactive to light. EOM are normal.  Neck: Normal range of motion. Neck supple.  Cardiovascular: Normal rate and [id number].  Pulmonary/Chest: Effort normal and breath sounds normal.  Abdominal: Soft.  Musculoskeletal: Normal range of motion.  The patient does not have any midline tenderness. No weakness, masses, step off or  appreciated. Strengths and sensory functions are physiologic and symmetrical. No erythema, ecchymosis, CVA. The patient has a normal gait.    Neurological: He is alert and oriented to person, place, and time.  Skin: Skin is warm and dry.  Nursing note and vitals reviewed.   ED Treatments / Results  Labs (all labs ordered are listed, but only abnormal results are displayed) Labs Reviewed  URINALYSIS, ROUTINE W REFLEX MICROSCOPIC - Abnormal; Notable for the  following:       Result Value   APPearance HAZY (*)    Hgb urine dipstick MODERATE (*)    Squamous Epithelial / LPF 6-30 (*)    All other components within normal limits  HIV ANTIBODY (ROUTINE TESTING)  GC/CHLAMYDIA PROBE AMP ([place]) NOT AT [facility]    EKG  EKG Interpretation None       Radiology No results found.  Procedures Procedures (including critical care time)  Medications Ordered in ED Medications  cefTRIAXone (ROCEPHIN) injection 250 mg (not administered)  azithromycin (ZITHROMAX) tablet 1,000 mg (not administered)  metroNIDAZOLE (FLAGYL) tablet 2,000 mg (not administered)     Initial Impression / Assessment and Plan / ED Course  I have reviewed the triage vital signs and the nursing notes.  Pertinent labs & imaging results that were available during my care of the patient were reviewed by me and considered in my medical decision making (see chart for details).  Tested for HIV, and GC. Will be treated for GC and Trichomoniasis in the ED. He see's [name] tomorrow. I recommend he have his urine rechecked to see if there is still hematuria<< his back pain is not consistent with kidney stones. If he continues to have hematuria, he will need urology referral.  The patient denies having any red flag symptoms. No signs of abscess, tumor, hematoma. The patient has had no fevers, is not immune compromised, no history of IV drug use or bacteremia. No systemic history of cancer. The patient denies weight loss and is not on any anticoagulations. There is been no traumatic injury. The patient denies any weakness, numbness, inability to walk.  I will refer to Ortho. Rx NSAIDS and muscle relaxers.  Blood pressure (!) 161/103, pulse 80, temperature 98.5 F (36.9 C), temperature source Oral, resp. rate 18, SpO2 96 %.  Gregory Gay has been evaluated today in the emergency department. The appropriate screening and testing was been performed and I believe  the patient to be medically stable for discharge.   Return signs and symptoms have been discussed with the patient and/or caregivers and they have voiced their understanding. The patient has agreed to follow-up with their primary care provider or the referred specialist.   Final Clinical Impressions(s) / ED Diagnoses   Final diagnoses:  Hematuria, unspecified type  Chronic low back pain, unspecified back pain laterality, with sciatica presence unspecified  Concern about STD in male without diagnosis    New Prescriptions New Prescriptions   No medications on file     Marlon Pel, PA-C 07/22/17 1134    Bethann Berkshire, MD 07/22/17 [phone]

## 2017-07-22 NOTE — ED Triage Notes (Signed)
Patient complains of lower back pain for years and worse since Saturday with movement, denies any new trauma. Also reports hematuria x 2 days and wants to make sure he has no STD.

## 2017-07-23 ENCOUNTER — Ambulatory Visit: Payer: Self-pay | Attending: Internal Medicine | Admitting: Pharmacist

## 2017-07-23 ENCOUNTER — Encounter: Payer: Self-pay | Admitting: Pharmacist

## 2017-07-23 DIAGNOSIS — I10 Essential (primary) hypertension: Secondary | ICD-10-CM | POA: Insufficient documentation

## 2017-07-23 LAB — HIV ANTIBODY (ROUTINE TESTING W REFLEX): HIV SCREEN 4TH GENERATION: NONREACTIVE

## 2017-07-23 MED ORDER — VALSARTAN 80 MG PO TABS: 80.0000 mg | ORAL_TABLET | Freq: Every day | ORAL | 0 refills | Status: DC

## 2017-07-23 MED ORDER — HYDROCHLOROTHIAZIDE 25 MG PO TABS: 25.0000 mg | ORAL_TABLET | Freq: Every day | ORAL | 0 refills | Status: DC

## 2017-07-23 NOTE — Patient Instructions (Signed)
Thanks for coming to see [place]  Make sure to take the valsartan and hydrochlorothiazide for your blood pressure  Come back in 1 week for a blood pressure check   Hypertension Hypertension is another name for high blood pressure. High blood pressure forces your heart to work harder to pump blood. This can cause problems over time. There are two numbers in a blood pressure reading. There is a top number (systolic) over a bottom number (diastolic). It is best to have a blood pressure below 120/80. Healthy choices can help lower your blood pressure. You may need medicine to help lower your blood pressure if:  Your blood pressure cannot be lowered with healthy choices.  Your blood pressure is higher than 130/80.  Follow these instructions at home: Eating and drinking  If directed, follow the DASH eating plan. This diet includes: ? Filling half of your plate at each meal with fruits and vegetables. ? Filling one quarter of your plate at each meal with whole grains. Whole grains include whole wheat pasta, brown rice, and whole grain bread. ? Eating or drinking low-fat dairy products, such as skim milk or low-fat yogurt. ? Filling one quarter of your plate at each meal with low-fat (lean) proteins. Low-fat proteins include fish, skinless chicken, eggs, beans, and tofu. ? Avoiding fatty meat, cured and processed meat, or chicken with skin. ? Avoiding premade or processed food.  Eat less than 1,500 mg of salt (sodium) a day.  Limit alcohol use to no more than 1 drink a day for nonpregnant women and 2 drinks a day for men. One drink equals 12 oz of beer, 5 oz of wine, or 1 oz of hard liquor. Lifestyle  Work with your doctor to stay at a healthy weight or to lose weight. Ask your doctor what the best weight is for you.  Get at least 30 minutes of exercise that causes your heart to beat faster (aerobic exercise) most days of the week. This may include walking, swimming, or biking.  Get at least  30 minutes of exercise that strengthens your muscles (resistance exercise) at least 3 days a week. This may include lifting weights or pilates.  Do not use any products that contain nicotine or tobacco. This includes cigarettes and e-cigarettes. If you need help quitting, ask your doctor.  Check your blood pressure at home as told by your doctor.  Keep all follow-up visits as told by your doctor. This is important. Medicines  Take over-the-counter and prescription medicines only as told by your doctor. Follow directions carefully.  Do not skip doses of blood pressure medicine. The medicine does not work as well if you skip doses. Skipping doses also puts you at risk for problems.  Ask your doctor about side effects or reactions to medicines that you should watch for. Contact a doctor if:  You think you are having a reaction to the medicine you are taking.  You have headaches that keep coming back (recurring).  You feel dizzy.  You have swelling in your ankles.  You have trouble with your vision. Get help right away if:  You get a very bad headache.  You start to feel confused.  You feel weak or numb.  You feel faint.  You get very bad pain in your: ? Chest. ? Belly (abdomen).  You throw up (vomit) more than once.  You have trouble breathing. Summary  Hypertension is another name for high blood pressure.  Making healthy choices can help lower blood  pressure. If your blood pressure cannot be controlled with healthy choices, you may need to take medicine. This information is not intended to replace advice given to you by your health care provider. Make sure you discuss any questions you have with your health care provider. Document Released: 03/17/2008 Document Revised: 08/27/2016 Document Reviewed: 08/27/2016 Elsevier Interactive Patient Education  [employer].

## 2017-07-23 NOTE — Progress Notes (Signed)
   S:    Patient arrives in good spirits.  Presents to the clinic for hypertension evaluation. Patient was referred on 06/22/17 by Dr. Hyman Hopes.  Patient was last seen by Primary Care Provider on 06/22/17.   Patient reports adherence with medications. However, he only remembers the name of his hydrochlorothiazide and not the valsartan. And the pharmacy records here show that he has not picked up his medications recently. He denies using any other pharmacy.  Current BP Medications include:  Valsartan 80 mg daily, HCTZ 25 mg daily  Was seen in ED yesterday, no complaints. Will defer to Dr. Hyman Hopes for further workup.  O:   Last 3 Office BP readings: BP Readings from Last 3 Encounters:  07/23/17 136/88  07/22/17 (!) 150/97  06/22/17 (!) 162/107    BMET    Component Value Date/Time   NA 142 06/22/2017 1059   K 3.8 06/22/2017 1059   CL 102 06/22/2017 1059   CO2 25 06/22/2017 1059   GLUCOSE 90 06/22/2017 1059   GLUCOSE 79 11/22/2015 1722   BUN 11 06/22/2017 1059   CREATININE 0.80 06/22/2017 1059   CREATININE 0.89 11/22/2015 1722   CALCIUM 9.2 06/22/2017 1059   GFRNONAA 103 06/22/2017 1059   GFRNONAA >89 11/22/2015 1722   GFRAA 120 06/22/2017 1059   GFRAA >89 11/22/2015 1722    A/P: Hypertension longstanding currently <140/90 on current medications but had elevated blood pressures recently and he is not sure what medications he is taking. Reordered his medications and he will return in 2 weeks to see Dr. Hyman Hopes and bring his medications with him so we know if he is taking the valsartan or not.   Results reviewed and written information provided.   Total time in face-to-face counseling 20 minutes.   F/U Clinic Visit with Dr. Hyman Hopes in 2 weeks.  Patient seen with Theodoro Kos, PharmD Candidate

## 2017-08-05 ENCOUNTER — Encounter: Payer: Self-pay | Admitting: Internal Medicine

## 2017-08-18 ENCOUNTER — Emergency Department (HOSPITAL_COMMUNITY)
Admission: EM | Admit: 2017-08-18 | Discharge: 2017-08-18 | Disposition: A | Discharge status: Home / Self Care | Payer: Self-pay | Attending: Emergency Medicine | Admitting: Emergency Medicine

## 2017-08-18 ENCOUNTER — Other Ambulatory Visit: Payer: Self-pay

## 2017-08-18 ENCOUNTER — Encounter (HOSPITAL_COMMUNITY): Payer: Self-pay | Admitting: Emergency Medicine

## 2017-08-18 DIAGNOSIS — I10 Essential (primary) hypertension: Secondary | ICD-10-CM | POA: Insufficient documentation

## 2017-08-18 DIAGNOSIS — F17210 Nicotine dependence, cigarettes, uncomplicated: Secondary | ICD-10-CM | POA: Insufficient documentation

## 2017-08-18 DIAGNOSIS — Z79899 Other long term (current) drug therapy: Secondary | ICD-10-CM | POA: Insufficient documentation

## 2017-08-18 DIAGNOSIS — Z202 Contact with and (suspected) exposure to infections with a predominantly sexual mode of transmission: Secondary | ICD-10-CM | POA: Insufficient documentation

## 2017-08-18 DIAGNOSIS — Z113 Encounter for screening for infections with a predominantly sexual mode of transmission: Secondary | ICD-10-CM | POA: Insufficient documentation

## 2017-08-18 DIAGNOSIS — M545 Low back pain, unspecified: Secondary | ICD-10-CM

## 2017-08-18 DIAGNOSIS — G8929 Other chronic pain: Secondary | ICD-10-CM | POA: Insufficient documentation

## 2017-08-18 HISTORY — DX: Accidental discharge from unspecified firearms or gun, initial encounter: W34.00XA

## 2017-08-18 HISTORY — DX: Blindness, one eye, unspecified eye: H54.40

## 2017-08-18 LAB — URINALYSIS, ROUTINE W REFLEX MICROSCOPIC
Bilirubin Urine: NEGATIVE
GLUCOSE, UA: NEGATIVE mg/dL
Ketones, ur: NEGATIVE mg/dL
Leukocytes, UA: NEGATIVE
NITRITE: NEGATIVE
PH: 7 (ref 5.0–8.0)
PROTEIN: NEGATIVE mg/dL
SPECIFIC GRAVITY, URINE: 1.019 (ref 1.005–1.030)

## 2017-08-18 MED ORDER — METHOCARBAMOL 500 MG PO TABS: 500.0000 mg | ORAL_TABLET | Freq: Three times a day (TID) | ORAL | 0 refills | Status: DC | PRN

## 2017-08-18 MED ORDER — METRONIDAZOLE 500 MG PO TABS
2000.0000 mg | ORAL_TABLET | Freq: Once | ORAL | Status: AC
  Administered 2017-08-18: 2000 mg via ORAL
  Filled 2017-08-18: qty 4

## 2017-08-18 MED ORDER — PROMETHAZINE HCL 25 MG PO TABS
25.0000 mg | ORAL_TABLET | ORAL | Status: AC
  Administered 2017-08-18: 25 mg via ORAL
  Filled 2017-08-18: qty 1

## 2017-08-18 NOTE — Discharge Instructions (Signed)
You have been treated for trichomonas today based on your history.  Do not engage in sexual intercourse for 1 week.  Call the health department in 2 days to follow-up on your pending STD test.  We recommend that you continue with ibuprofen for back pain.  You may add Robaxin as needed for persistent pain or spasms.  Alternate ice and heat to your back as well.  Follow-up with your primary care doctor regarding your visit today.
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
## 2017-08-18 NOTE — ED Triage Notes (Signed)
Pt  Request to be checked for STD . Pt reports he fist saw blood in his urine on SAT.

## 2017-08-18 NOTE — ED Notes (Signed)
Declined W/C at D/C and was escorted to lobby by RN. 

## 2017-08-18 NOTE — ED Triage Notes (Signed)
Patient reports intermittent hematuria with mild dysuria onset 2 days ago .

## 2017-08-19 LAB — HIV ANTIBODY (ROUTINE TESTING W REFLEX): HIV SCREEN 4TH GENERATION: NONREACTIVE

## 2017-08-19 LAB — RPR: RPR Ser Ql: NONREACTIVE

## 2017-08-31 ENCOUNTER — Encounter: Payer: Self-pay | Admitting: Internal Medicine

## 2017-11-18 ENCOUNTER — Encounter: Payer: Self-pay | Admitting: Internal Medicine

## 2017-11-18 ENCOUNTER — Ambulatory Visit: Payer: Self-pay | Attending: Internal Medicine | Admitting: Internal Medicine

## 2017-11-18 VITALS — BP 147/89 | HR 86 | Temp 98.4°F | Resp 16 | Ht 65.0 in | Wt 230.6 lb

## 2017-11-18 DIAGNOSIS — E785 Hyperlipidemia, unspecified: Secondary | ICD-10-CM | POA: Insufficient documentation

## 2017-11-18 DIAGNOSIS — M545 Low back pain: Secondary | ICD-10-CM | POA: Insufficient documentation

## 2017-11-18 DIAGNOSIS — H5461 Unqualified visual loss, right eye, normal vision left eye: Secondary | ICD-10-CM | POA: Insufficient documentation

## 2017-11-18 DIAGNOSIS — G8929 Other chronic pain: Secondary | ICD-10-CM | POA: Insufficient documentation

## 2017-11-18 DIAGNOSIS — Z79899 Other long term (current) drug therapy: Secondary | ICD-10-CM | POA: Insufficient documentation

## 2017-11-18 DIAGNOSIS — G4733 Obstructive sleep apnea (adult) (pediatric): Secondary | ICD-10-CM | POA: Insufficient documentation

## 2017-11-18 DIAGNOSIS — M1611 Unilateral primary osteoarthritis, right hip: Secondary | ICD-10-CM | POA: Insufficient documentation

## 2017-11-18 DIAGNOSIS — I10 Essential (primary) hypertension: Secondary | ICD-10-CM | POA: Insufficient documentation

## 2017-11-18 DIAGNOSIS — Z1211 Encounter for screening for malignant neoplasm of colon: Secondary | ICD-10-CM

## 2017-11-18 MED ORDER — MELOXICAM 15 MG PO TABS: 15.0000 mg | ORAL_TABLET | Freq: Every day | ORAL | 3 refills | Status: DC

## 2017-11-18 MED ORDER — HYDROCHLOROTHIAZIDE 25 MG PO TABS: 25.0000 mg | ORAL_TABLET | Freq: Every day | ORAL | 3 refills | Status: DC

## 2017-11-18 MED ORDER — VALSARTAN 80 MG PO TABS: 80.0000 mg | ORAL_TABLET | Freq: Every day | ORAL | 3 refills | Status: DC

## 2017-11-18 MED ORDER — LOVASTATIN 20 MG PO TABS: 20.0000 mg | ORAL_TABLET | Freq: Every morning | ORAL | 3 refills | Status: DC

## 2017-11-18 MED ORDER — DICLOFENAC SODIUM 1 % TD GEL: 4.0000 g | Freq: Four times a day (QID) | TRANSDERMAL | 1 refills | Status: DC

## 2017-11-18 MED ORDER — LOSARTAN POTASSIUM 50 MG PO TABS: 50.0000 mg | ORAL_TABLET | Freq: Every day | ORAL | 3 refills | Status: DC

## 2017-11-18 MED ORDER — METHOCARBAMOL 500 MG PO TABS: 500.0000 mg | ORAL_TABLET | Freq: Three times a day (TID) | ORAL | 3 refills | Status: DC | PRN

## 2017-11-18 MED ORDER — ACETAMINOPHEN-CODEINE #3 300-30 MG PO TABS: 1.0000 | ORAL_TABLET | ORAL | 0 refills | Status: DC | PRN

## 2017-11-18 MED FILL — ACETAMINOPHEN/COD #3 TABLET: 300-30 | 15 days supply | Qty: 90 | Fill #0

## 2017-11-18 MED FILL — MELOXICAM 15 MG TABLET: 15 | 30 days supply | Qty: 30 | Fill #0

## 2017-11-18 MED FILL — LOSARTAN POTASSIUM 50 MG TA: 50 | 30 days supply | Qty: 30 | Fill #0

## 2017-11-18 NOTE — Patient Instructions (Signed)
DASH Eating Plan DASH stands for "Dietary Approaches to Stop Hypertension." The DASH eating plan is a healthy eating plan that has been shown to reduce high blood pressure (hypertension). It may also reduce your risk for type 2 diabetes, heart disease, and stroke. The DASH eating plan may also help with weight loss. What are tips for following this plan? General guidelines  Avoid eating more than 2,300 mg (milligrams) of salt (sodium) a day. If you have hypertension, you may need to reduce your sodium intake to 1,500 mg a day.  Limit alcohol intake to no more than 1 drink a day for nonpregnant women and 2 drinks a day for men. One drink equals 12 oz of beer, 5 oz of wine, or 1 oz of hard liquor.  Work with your health care provider to maintain a healthy body weight or to lose weight. Ask what an ideal weight is for you.  Get at least 30 minutes of exercise that causes your heart to beat faster (aerobic exercise) most days of the week. Activities may include walking, swimming, or biking.  Work with your health care provider or diet and nutrition specialist (dietitian) to adjust your eating plan to your individual calorie needs. Reading food labels  Check food labels for the amount of sodium per serving. Choose foods with less than 5 percent of the Daily Value of sodium. Generally, foods with less than 300 mg of sodium per serving fit into this eating plan.  To find whole grains, look for the word "whole" as the first word in the ingredient list. Shopping  Buy products labeled as "low-sodium" or "no salt added."  Buy fresh foods. Avoid canned foods and premade or frozen meals. Cooking  Avoid adding salt when cooking. Use salt-free seasonings or herbs instead of table salt or sea salt. Check with your health care provider or pharmacist before using salt substitutes.  Do not fry foods. Cook foods using healthy methods such as baking, boiling, grilling, and broiling instead.  Cook with  heart-healthy oils, such as olive, canola, soybean, or sunflower oil. Meal planning   Eat a balanced diet that includes: ? 5 or more servings of fruits and vegetables each day. At each meal, try to fill half of your plate with fruits and vegetables. ? Up to 6-8 servings of whole grains each day. ? Less than 6 oz of lean meat, poultry, or fish each day. A 3-oz serving of meat is about the same size as a deck of cards. One egg equals 1 oz. ? 2 servings of low-fat dairy each day. ? A serving of nuts, seeds, or beans 5 times each week. ? Heart-healthy fats. Healthy fats called Omega-3 fatty acids are found in foods such as flaxseeds and coldwater fish, like sardines, salmon, and mackerel.  Limit how much you eat of the following: ? Canned or prepackaged foods. ? Food that is high in trans fat, such as fried foods. ? Food that is high in saturated fat, such as fatty meat. ? Sweets, desserts, sugary drinks, and other foods with added sugar. ? Full-fat dairy products.  Do not salt foods before eating.  Try to eat at least 2 vegetarian meals each week.  Eat more home-cooked food and less restaurant, buffet, and fast food.  When eating at a restaurant, ask that your food be prepared with less salt or no salt, if possible. What foods are recommended? The items listed may not be a complete list. Talk with your dietitian about what   dietary choices are best for you. Grains Whole-grain or whole-wheat bread. Whole-grain or whole-wheat pasta. Brown rice. Oatmeal. Quinoa. Bulgur. Whole-grain and low-sodium cereals. Pita bread. Low-fat, low-sodium crackers. Whole-wheat flour tortillas. Vegetables Fresh or frozen vegetables (raw, steamed, roasted, or grilled). Low-sodium or reduced-sodium tomato and vegetable juice. Low-sodium or reduced-sodium tomato sauce and tomato paste. Low-sodium or reduced-sodium canned vegetables. Fruits All fresh, dried, or frozen fruit. Canned fruit in natural juice (without  added sugar). Meat and other protein foods Skinless chicken or turkey. Ground chicken or turkey. Pork with fat trimmed off. Fish and seafood. Egg whites. Dried beans, peas, or lentils. Unsalted nuts, nut butters, and seeds. Unsalted canned beans. Lean cuts of beef with fat trimmed off. Low-sodium, lean deli meat. Dairy Low-fat (1%) or fat-free (skim) milk. Fat-free, low-fat, or reduced-fat cheeses. Nonfat, low-sodium ricotta or cottage cheese. Low-fat or nonfat yogurt. Low-fat, low-sodium cheese. Fats and oils Soft margarine without trans fats. Vegetable oil. Low-fat, reduced-fat, or light mayonnaise and salad dressings (reduced-sodium). Canola, safflower, olive, soybean, and sunflower oils. Avocado. Seasoning and other foods Herbs. Spices. Seasoning mixes without salt. Unsalted popcorn and pretzels. Fat-free sweets. What foods are not recommended? The items listed may not be a complete list. Talk with your dietitian about what dietary choices are best for you. Grains Baked goods made with fat, such as croissants, muffins, or some breads. Dry pasta or rice meal packs. Vegetables Creamed or fried vegetables. Vegetables in a cheese sauce. Regular canned vegetables (not low-sodium or reduced-sodium). Regular canned tomato sauce and paste (not low-sodium or reduced-sodium). Regular tomato and vegetable juice (not low-sodium or reduced-sodium). Pickles. Olives. Fruits Canned fruit in a light or heavy syrup. Fried fruit. Fruit in cream or butter sauce. Meat and other protein foods Fatty cuts of meat. Ribs. Fried meat. Bacon. Sausage. Bologna and other processed lunch meats. Salami. Fatback. Hotdogs. Bratwurst. Salted nuts and seeds. Canned beans with added salt. Canned or smoked fish. Whole eggs or egg yolks. Chicken or turkey with skin. Dairy Whole or 2% milk, cream, and half-and-half. Whole or full-fat cream cheese. Whole-fat or sweetened yogurt. Full-fat cheese. Nondairy creamers. Whipped toppings.  Processed cheese and cheese spreads. Fats and oils Butter. Stick margarine. Lard. Shortening. Ghee. Bacon fat. Tropical oils, such as coconut, palm kernel, or palm oil. Seasoning and other foods Salted popcorn and pretzels. Onion salt, garlic salt, seasoned salt, table salt, and sea salt. Worcestershire sauce. Tartar sauce. Barbecue sauce. Teriyaki sauce. Soy sauce, including reduced-sodium. Steak sauce. Canned and packaged gravies. Fish sauce. Oyster sauce. Cocktail sauce. Horseradish that you find on the shelf. Ketchup. Mustard. Meat flavorings and tenderizers. Bouillon cubes. Hot sauce and Tabasco sauce. Premade or packaged marinades. Premade or packaged taco seasonings. Relishes. Regular salad dressings. Where to find more information:  National Heart, Lung, and Blood Institute: www.nhlbi.nih.gov  American Heart Association: www.heart.org Summary  The DASH eating plan is a healthy eating plan that has been shown to reduce high blood pressure (hypertension). It may also reduce your risk for type 2 diabetes, heart disease, and stroke.  With the DASH eating plan, you should limit salt (sodium) intake to 2,300 mg a day. If you have hypertension, you may need to reduce your sodium intake to 1,500 mg a day.  When on the DASH eating plan, aim to eat more fresh fruits and vegetables, whole grains, lean proteins, low-fat dairy, and heart-healthy fats.  Work with your health care provider or diet and nutrition specialist (dietitian) to adjust your eating plan to your individual   calorie needs. This information is not intended to replace advice given to you by your health care provider. Make sure you discuss any questions you have with your health care provider. Document Released: 09/18/2011 Document Revised: 09/22/2016 Document Reviewed: 09/22/2016 Elsevier Interactive Patient Education  2018 Elsevier Inc.  Hypertension Hypertension is another name for high blood pressure. High blood pressure  forces your heart to work harder to pump blood. This can cause problems over time. There are two numbers in a blood pressure reading. There is a top number (systolic) over a bottom number (diastolic). It is best to have a blood pressure below 120/80. Healthy choices can help lower your blood pressure. You may need medicine to help lower your blood pressure if:  Your blood pressure cannot be lowered with healthy choices.  Your blood pressure is higher than 130/80.  Follow these instructions at home: Eating and drinking  If directed, follow the DASH eating plan. This diet includes: ? Filling half of your plate at each meal with fruits and vegetables. ? Filling one quarter of your plate at each meal with whole grains. Whole grains include whole wheat pasta, brown rice, and whole grain bread. ? Eating or drinking low-fat dairy products, such as skim milk or low-fat yogurt. ? Filling one quarter of your plate at each meal with low-fat (lean) proteins. Low-fat proteins include fish, skinless chicken, eggs, beans, and tofu. ? Avoiding fatty meat, cured and processed meat, or chicken with skin. ? Avoiding premade or processed food.  Eat less than 1,500 mg of salt (sodium) a day.  Limit alcohol use to no more than 1 drink a day for nonpregnant women and 2 drinks a day for men. One drink equals 12 oz of beer, 5 oz of wine, or 1 oz of hard liquor. Lifestyle  Work with your doctor to stay at a healthy weight or to lose weight. Ask your doctor what the best weight is for you.  Get at least 30 minutes of exercise that causes your heart to beat faster (aerobic exercise) most days of the week. This may include walking, swimming, or biking.  Get at least 30 minutes of exercise that strengthens your muscles (resistance exercise) at least 3 days a week. This may include lifting weights or pilates.  Do not use any products that contain nicotine or tobacco. This includes cigarettes and e-cigarettes. If you  need help quitting, ask your doctor.  Check your blood pressure at home as told by your doctor.  Keep all follow-up visits as told by your doctor. This is important. Medicines  Take over-the-counter and prescription medicines only as told by your doctor. Follow directions carefully.  Do not skip doses of blood pressure medicine. The medicine does not work as well if you skip doses. Skipping doses also puts you at risk for problems.  Ask your doctor about side effects or reactions to medicines that you should watch for. Contact a doctor if:  You think you are having a reaction to the medicine you are taking.  You have headaches that keep coming back (recurring).  You feel dizzy.  You have swelling in your ankles.  You have trouble with your vision. Get help right away if:  You get a very bad headache.  You start to feel confused.  You feel weak or numb.  You feel faint.  You get very bad pain in your: ? Chest. ? Belly (abdomen).  You throw up (vomit) more than once.  You have trouble breathing.   Summary  Hypertension is another name for high blood pressure.  Making healthy choices can help lower blood pressure. If your blood pressure cannot be controlled with healthy choices, you may need to take medicine. This information is not intended to replace advice given to you by your health care provider. Make sure you discuss any questions you have with your health care provider. Document Released: 03/17/2008 Document Revised: 08/27/2016 Document Reviewed: 08/27/2016 Elsevier Interactive Patient Education  2018 Elsevier Inc.  
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
## 2017-11-25 ENCOUNTER — Ambulatory Visit (INDEPENDENT_AMBULATORY_CARE_PROVIDER_SITE_OTHER): Payer: Self-pay | Admitting: Family Medicine

## 2017-11-25 ENCOUNTER — Encounter: Payer: Self-pay | Admitting: Family Medicine

## 2017-11-25 VITALS — BP 139/84 | HR 92 | Ht 64.0 in | Wt 230.0 lb

## 2017-11-25 DIAGNOSIS — M62830 Muscle spasm of back: Secondary | ICD-10-CM | POA: Insufficient documentation

## 2017-11-25 DIAGNOSIS — G5701 Lesion of sciatic nerve, right lower limb: Secondary | ICD-10-CM

## 2017-11-25 DIAGNOSIS — M461 Sacroiliitis, not elsewhere classified: Secondary | ICD-10-CM

## 2017-11-25 NOTE — Assessment & Plan Note (Signed)
See plan above.
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
## 2017-11-25 NOTE — Assessment & Plan Note (Signed)
Patient is here with lumbar back pain with some radiation down the posterior aspect of the right leg to the level of the knee.  Signs and symptoms are consistent with paraspinal muscle spasms, SI joint inflammation, and possible piriformis syndrome.  Differential diagnosis includes lumbar radiculopathy (unlikely due to very reassuring lumbar radiographs obtained recently), IT band syndrome (unlikely due to majority of pain originating posteriorly), or gluteus medius muscle/tendon pathology (less likely due to no previous injury and no discomfort at the greater trochanter). -Discussed continued NSAID use -Formal physical therapy ordered today.  Would appreciate focus on core strength, and hip/SI flexibility and strengthening. -RICE therapy as needed -Informed patient that all pain medications would need to be provided by PCP if he/she deems it warranted. -Follow-up after 6-8 weeks of PT  Next: Further imaging may be warranted if symptoms persist without improvement with monitored PT.  Would consider lumbar/hip-pelvis MRI due to location of symptoms.

## 2017-12-07 ENCOUNTER — Ambulatory Visit: Payer: Self-pay | Attending: Family Medicine | Admitting: Physical Therapy

## 2017-12-16 ENCOUNTER — Encounter: Payer: Self-pay | Admitting: Internal Medicine

## 2018-01-06 ENCOUNTER — Ambulatory Visit: Payer: Self-pay | Admitting: Family Medicine

## 2018-01-13 ENCOUNTER — Ambulatory Visit (INDEPENDENT_AMBULATORY_CARE_PROVIDER_SITE_OTHER): Payer: Self-pay | Admitting: Family Medicine

## 2018-01-13 ENCOUNTER — Encounter: Payer: Self-pay | Admitting: Family Medicine

## 2018-01-13 DIAGNOSIS — M545 Low back pain: Secondary | ICD-10-CM

## 2018-01-13 DIAGNOSIS — G8929 Other chronic pain: Secondary | ICD-10-CM

## 2018-01-13 MED ORDER — MELOXICAM 15 MG PO TABS: 15.0000 mg | ORAL_TABLET | Freq: Every day | ORAL | 2 refills | Status: DC

## 2018-01-13 MED ORDER — PREDNISONE 10 MG PO TABS: ORAL_TABLET | ORAL | 0 refills | Status: DC

## 2018-01-13 MED ORDER — DICLOFENAC SODIUM 1 % TD GEL: 4.0000 g | Freq: Four times a day (QID) | TRANSDERMAL | 3 refills | Status: DC

## 2018-01-13 MED FILL — LOSARTAN POTASSIUM 50 MG TA: 50 | 30 days supply | Qty: 30 | Fill #1

## 2018-01-13 MED FILL — MELOXICAM 15 MG TABLET: 15 | 30 days supply | Qty: 30 | Fill #0

## 2018-01-13 MED FILL — predniSONE 10 MG TABS: 10 | 6 days supply | Qty: 21 | Fill #0

## 2018-01-13 NOTE — Assessment & Plan Note (Addendum)
Patient is here with chronic low back pain.  Symptoms consistent with a myofascial etiology overall.  No red flag symptoms.  No true radicular symptoms. -6-day prednisone Dosepak provided today. -Patient is to continue home exercise program. -Refilled Voltaren gel and Mobic Rx's -During the exam it was noted that patient had a above average sized wallet.  Upon inquiry patient states that he always wears this in his right back pocket.  I informed him that this could be relating to some of his right-sided back pain.  I have asked that over the next 1 month he switched sides to the left side patient was very receptive to this idea. -Patient is to follow-up in 4 weeks.  Next: Consideration for lumbar MRI with referral for spinal injections if symptoms persist and patient is looking for more aggressive interventions.

## 2018-01-13 NOTE — Progress Notes (Signed)
HPI  CC: Low back pain follow-up Patient is here to follow-up regarding his low back pain.  He was last seen approximately 6 weeks ago for the same issue.  Back pain has been a chronic issue for this patient over the past 2+ years.  At the last visit we had prescribed formal physical therapy and stretching.  We had also discussed NSAIDs for anti-inflammatory purposes, as well as weight loss.  Patient states that he had been unable to continue physical therapy due to cost.  He was, fortunately, able to receive some home exercises from his initial visit which she states he has been compliant with.  Patient states that his medication combination of Voltaren gel, meloxicam, and Tylenol #3 have provided him the most benefit.  Unfortunately he states that he has recently run out of these medications and is asking for refills today.  Regarding symptoms, patient states that he continues to have the lower lumbar discomfort.  Localized primarily along the right side.  No midline tenderness.  No radiation of symptoms beyond the knee but he does have some discomfort into the buttock and posterior right thigh.  He denies any weakness, numbness, or paresthesias.  No bowel or bladder incontinence.  Medications/Interventions Tried: Physical therapy, NSAIDs  See HPI and/or previous note for associated ROS.  Objective: BP 130/75   Ht 5\' 4"  (1.626 m)   Wt 230 lb (104.3 kg)   BMI 39.48 kg/m  Gen: NAD, well groomed, a/o x3, normal affect. Presented using cane. CV: Well-perfused. Warm.  Resp: Non-labored.  Neuro: Sensation intact throughout. No gross coordination deficits.  Gait: Nonpathologic posture, unremarkable stride without signs of limp or balance issues. Back: Inspection yields no evidence of erythema, ecchymosis, or bony deformity.  TTP along the right paralumbar musculature and the right SI joint.  Slight TTP along the right piriformis muscle. No TTP at the greater trochanter or spinous processes.   Significant hamstring tightness with hip flexion (R>L).  Significant SI joint tightness on the right side.  Right-sided positive FABER.  Negative SLR bilaterally.   Assessment and plan:  Low back pain Patient is here with chronic low back pain.  Symptoms consistent with a myofascial etiology overall.  No red flag symptoms.  No true radicular symptoms. -6-day prednisone Dosepak provided today. -Patient is to continue home exercise program. -Refilled Voltaren gel and Mobic Rx's -During the exam it was noted that patient had a above average sized wallet.  Upon inquiry patient states that he always wears this in his right back pocket.  I informed him that this could be relating to some of his right-sided back pain.  I have asked that over the next 1 month he switched sides to the left side patient was very receptive to this idea. -Patient is to follow-up in 4 weeks.  Next: Consideration for lumbar MRI with referral for spinal injections if symptoms persist and patient is looking for more aggressive interventions.   Meds ordered this encounter  Medications  . predniSONE (DELTASONE) 10 MG tablet    Sig: Use as directed per doctors orders.    Dispense:  21 tablet    Refill:  0  . diclofenac sodium (VOLTAREN) 1 % GEL    Sig: Apply 4 g topically 4 (four) times daily.    Dispense:  2 Tube    Refill:  3  . meloxicam (MOBIC) 15 MG tablet    Sig: Take 1 tablet (15 mg total) by mouth daily.    Dispense:  30 tablet    Refill:  2     Kathee DeltonIan D McKeag, MD,MS [facility] Fellow 01/13/2018 3:32 PM

## 2018-01-18 ENCOUNTER — Ambulatory Visit: Payer: Self-pay | Admitting: Nurse Practitioner

## 2018-04-07 ENCOUNTER — Ambulatory Visit: Payer: Self-pay | Admitting: Family Medicine

## 2018-04-12 ENCOUNTER — Ambulatory Visit: Payer: Self-pay

## 2018-04-21 ENCOUNTER — Ambulatory Visit: Payer: Self-pay | Admitting: Family Medicine

## 2018-05-31 ENCOUNTER — Other Ambulatory Visit: Payer: Self-pay

## 2018-05-31 ENCOUNTER — Encounter (HOSPITAL_COMMUNITY): Payer: Self-pay | Admitting: Emergency Medicine

## 2018-05-31 ENCOUNTER — Emergency Department (HOSPITAL_COMMUNITY)
Admission: EM | Admit: 2018-05-31 | Discharge: 2018-05-31 | Disposition: A | Discharge status: Home / Self Care | Payer: Self-pay | Attending: Emergency Medicine | Admitting: Emergency Medicine

## 2018-05-31 DIAGNOSIS — N189 Chronic kidney disease, unspecified: Secondary | ICD-10-CM | POA: Insufficient documentation

## 2018-05-31 DIAGNOSIS — F17210 Nicotine dependence, cigarettes, uncomplicated: Secondary | ICD-10-CM | POA: Insufficient documentation

## 2018-05-31 DIAGNOSIS — Z79899 Other long term (current) drug therapy: Secondary | ICD-10-CM | POA: Insufficient documentation

## 2018-05-31 DIAGNOSIS — I129 Hypertensive chronic kidney disease with stage 1 through stage 4 chronic kidney disease, or unspecified chronic kidney disease: Secondary | ICD-10-CM | POA: Insufficient documentation

## 2018-05-31 DIAGNOSIS — N289 Disorder of kidney and ureter, unspecified: Secondary | ICD-10-CM

## 2018-05-31 DIAGNOSIS — E860 Dehydration: Secondary | ICD-10-CM | POA: Insufficient documentation

## 2018-05-31 DIAGNOSIS — R55 Syncope and collapse: Secondary | ICD-10-CM | POA: Insufficient documentation

## 2018-05-31 LAB — URINALYSIS, ROUTINE W REFLEX MICROSCOPIC
Bilirubin Urine: NEGATIVE
Glucose, UA: NEGATIVE mg/dL
KETONES UR: 5 mg/dL — AB
Nitrite: NEGATIVE
PH: 5 (ref 5.0–8.0)
Protein, ur: 30 mg/dL — AB
SPECIFIC GRAVITY, URINE: 1.028 (ref 1.005–1.030)

## 2018-05-31 LAB — BASIC METABOLIC PANEL
Anion gap: 11 (ref 5–15)
BUN: 15 mg/dL (ref 6–20)
CALCIUM: 10 mg/dL (ref 8.9–10.3)
CO2: 25 mmol/L (ref 22–32)
CREATININE: 2.21 mg/dL — AB (ref 0.61–1.24)
Chloride: 102 mmol/L (ref 98–111)
GFR calc Af Amer: 38 mL/min — ABNORMAL LOW (ref >60)
GFR calc non Af Amer: 32 mL/min — ABNORMAL LOW (ref >60)
GLUCOSE: 91 mg/dL (ref 70–99)
Potassium: 3.1 mmol/L — ABNORMAL LOW (ref 3.5–5.1)
Sodium: 138 mmol/L (ref 135–145)

## 2018-05-31 LAB — CBC
HCT: 44.1 % (ref 39.0–52.0)
Hemoglobin: 14.7 g/dL (ref 13.0–17.0)
MCH: 31.6 pg (ref 26.0–34.0)
MCHC: 33.3 g/dL (ref 30.0–36.0)
MCV: 94.8 fL (ref 78.0–100.0)
PLATELETS: 259 10*3/uL (ref 150–400)
RBC: 4.65 MIL/uL (ref 4.22–5.81)
RDW: 11.8 % (ref 11.5–15.5)
WBC: 6.9 10*3/uL (ref 4.0–10.5)

## 2018-05-31 LAB — CBG MONITORING, ED: GLUCOSE-CAPILLARY: 103 mg/dL — AB (ref 70–99)
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
## 2018-05-31 NOTE — Discharge Instructions (Signed)
Do not take your hydrochlorothiazide for the next 2 days.  Follow-up with your doctor for your change in renal function.  Keep yourself hydrated while you are outside.

## 2018-05-31 NOTE — ED Triage Notes (Signed)
Pt reports being at work when he went to his truck to get something to drink. Pt felt dizzy, nauseous and light-headed. He reports he passed out. Pt denies hitting his head and denies any injuries. He denies CP, SHOB, or vomiting.

## 2018-06-30 ENCOUNTER — Inpatient Hospital Stay: Payer: Self-pay

## 2018-07-16 ENCOUNTER — Inpatient Hospital Stay: Payer: Self-pay | Admitting: Family Medicine

## 2018-09-15 ENCOUNTER — Ambulatory Visit: Payer: Self-pay

## 2018-09-22 ENCOUNTER — Ambulatory Visit: Payer: Self-pay | Attending: Family Medicine | Admitting: Physician Assistant

## 2018-09-22 VITALS — BP 164/106 | HR 64 | Temp 97.6°F | Resp 16 | Wt 207.2 lb

## 2018-09-22 DIAGNOSIS — E785 Hyperlipidemia, unspecified: Secondary | ICD-10-CM | POA: Insufficient documentation

## 2018-09-22 DIAGNOSIS — G8929 Other chronic pain: Secondary | ICD-10-CM | POA: Insufficient documentation

## 2018-09-22 DIAGNOSIS — Z79899 Other long term (current) drug therapy: Secondary | ICD-10-CM | POA: Insufficient documentation

## 2018-09-22 DIAGNOSIS — M62838 Other muscle spasm: Secondary | ICD-10-CM | POA: Insufficient documentation

## 2018-09-22 DIAGNOSIS — M545 Low back pain: Secondary | ICD-10-CM | POA: Insufficient documentation

## 2018-09-22 DIAGNOSIS — I10 Essential (primary) hypertension: Secondary | ICD-10-CM | POA: Insufficient documentation

## 2018-09-22 DIAGNOSIS — H5461 Unqualified visual loss, right eye, normal vision left eye: Secondary | ICD-10-CM | POA: Insufficient documentation

## 2018-09-22 DIAGNOSIS — Z791 Long term (current) use of non-steroidal anti-inflammatories (NSAID): Secondary | ICD-10-CM | POA: Insufficient documentation

## 2018-09-22 MED ORDER — METHOCARBAMOL 500 MG PO TABS: ORAL_TABLET | ORAL | 1 refills | Status: DC

## 2018-09-22 MED ORDER — HYDROCHLOROTHIAZIDE 25 MG PO TABS: 25.0000 mg | ORAL_TABLET | Freq: Every day | ORAL | 1 refills | Status: DC

## 2018-09-22 MED ORDER — MELOXICAM 15 MG PO TABS: 15.0000 mg | ORAL_TABLET | Freq: Every day | ORAL | 2 refills | Status: DC

## 2018-09-22 MED ORDER — ACETAMINOPHEN-CODEINE #3 300-30 MG PO TABS: 1.0000 | ORAL_TABLET | ORAL | 0 refills | Status: DC | PRN

## 2018-09-22 MED ORDER — LOSARTAN POTASSIUM 50 MG PO TABS: 50.0000 mg | ORAL_TABLET | Freq: Every day | ORAL | 1 refills | Status: DC

## 2018-09-22 MED FILL — HYDROCHLOROTHIAZIDE 25 MG T: 25 | 30 days supply | Qty: 30 | Fill #0

## 2018-09-22 MED FILL — MELOXICAM 15 MG TABLET: 15 | 30 days supply | Qty: 30 | Fill #0

## 2018-09-22 MED FILL — ACETAMINOPHEN/COD #3 TABLET: 300-30 | 2 days supply | Qty: 15 | Fill #0

## 2018-09-22 MED FILL — LOSARTAN POTASSIUM 50 MG TA: 50 | 30 days supply | Qty: 30 | Fill #0

## 2018-09-22 MED FILL — METHOCARBAMOL 500 MG TABS: 500 | 15 days supply | Qty: 90 | Fill #0
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
## 2018-09-23 LAB — COMPREHENSIVE METABOLIC PANEL
ALBUMIN: 3.9 g/dL (ref 3.5–5.5)
ALT: 16 IU/L (ref 0–44)
AST: 20 IU/L (ref 0–40)
Albumin/Globulin Ratio: 2.8 — ABNORMAL HIGH (ref 1.2–2.2)
Alkaline Phosphatase: 59 IU/L (ref 39–117)
BILIRUBIN TOTAL: 0.7 mg/dL (ref 0.0–1.2)
BUN/Creatinine Ratio: 11 (ref 9–20)
BUN: 11 mg/dL (ref 6–24)
CO2: 23 mmol/L (ref 20–29)
Calcium: 8.9 mg/dL (ref 8.7–10.2)
Chloride: 103 mmol/L (ref 96–106)
Creatinine, Ser: 1.04 mg/dL (ref 0.76–1.27)
GFR calc Af Amer: 95 mL/min/{1.73_m2} (ref >59)
GFR, EST NON AFRICAN AMERICAN: 82 mL/min/{1.73_m2} (ref >59)
Globulin, Total: 1.4 g/dL — ABNORMAL LOW (ref 1.5–4.5)
Glucose: 95 mg/dL (ref 65–99)
Potassium: 4.2 mmol/L (ref 3.5–5.2)
SODIUM: 141 mmol/L (ref 134–144)
Total Protein: 5.3 g/dL — ABNORMAL LOW (ref 6.0–8.5)

## 2018-09-24 ENCOUNTER — Ambulatory Visit: Payer: Self-pay

## 2018-09-24 ENCOUNTER — Telehealth: Payer: Self-pay

## 2018-09-24 NOTE — Telephone Encounter (Signed)
Contacted pt to go over lab results pt answered then hung up 

## 2018-10-15 ENCOUNTER — Ambulatory Visit: Payer: Self-pay | Admitting: Sports Medicine

## 2018-10-29 ENCOUNTER — Ambulatory Visit: Payer: Self-pay | Admitting: Nurse Practitioner

## 2018-12-06 ENCOUNTER — Ambulatory Visit: Payer: Self-pay | Attending: Nurse Practitioner | Admitting: Nurse Practitioner

## 2018-12-06 ENCOUNTER — Encounter: Payer: Self-pay | Admitting: Nurse Practitioner

## 2018-12-06 VITALS — BP 128/83 | HR 97 | Temp 98.3°F | Ht 65.0 in | Wt 195.4 lb

## 2018-12-06 DIAGNOSIS — I10 Essential (primary) hypertension: Secondary | ICD-10-CM

## 2018-12-06 DIAGNOSIS — M545 Low back pain: Secondary | ICD-10-CM

## 2018-12-06 DIAGNOSIS — G8929 Other chronic pain: Secondary | ICD-10-CM

## 2018-12-06 DIAGNOSIS — E782 Mixed hyperlipidemia: Secondary | ICD-10-CM

## 2018-12-06 MED ORDER — MELOXICAM 15 MG PO TABS: 15.0000 mg | ORAL_TABLET | Freq: Every day | ORAL | 2 refills | Status: DC

## 2018-12-06 MED ORDER — LOVASTATIN 20 MG PO TABS: 20.0000 mg | ORAL_TABLET | Freq: Every morning | ORAL | 3 refills | Status: DC

## 2018-12-06 MED ORDER — HYDROCHLOROTHIAZIDE 25 MG PO TABS: 25.0000 mg | ORAL_TABLET | Freq: Every day | ORAL | 1 refills | Status: DC

## 2018-12-06 MED ORDER — LOSARTAN POTASSIUM 50 MG PO TABS: 50.0000 mg | ORAL_TABLET | Freq: Every day | ORAL | 1 refills | Status: DC

## 2018-12-06 NOTE — Patient Instructions (Signed)
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
## 2018-12-07 MED FILL — MELOXICAM 15 MG TABLET: 15 | 30 days supply | Qty: 30 | Fill #0

## 2018-12-10 ENCOUNTER — Encounter: Payer: Self-pay | Admitting: Nurse Practitioner

## 2019-01-19 ENCOUNTER — Ambulatory Visit: Payer: Self-pay | Attending: Nurse Practitioner | Admitting: Nurse Practitioner

## 2019-01-19 ENCOUNTER — Encounter: Payer: Self-pay | Admitting: Nurse Practitioner

## 2019-01-19 ENCOUNTER — Other Ambulatory Visit: Payer: Self-pay

## 2019-01-19 DIAGNOSIS — R000 Tachycardia, unspecified: Secondary | ICD-10-CM
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
## 2019-02-01 ENCOUNTER — Ambulatory Visit: Payer: Self-pay | Attending: Nurse Practitioner | Admitting: Nurse Practitioner

## 2019-02-01 ENCOUNTER — Other Ambulatory Visit: Payer: Self-pay

## 2019-02-01 ENCOUNTER — Encounter: Payer: Self-pay | Admitting: Nurse Practitioner

## 2019-02-01 DIAGNOSIS — M62838 Other muscle spasm: Secondary | ICD-10-CM

## 2019-02-01 DIAGNOSIS — I10 Essential (primary) hypertension: Secondary | ICD-10-CM

## 2019-02-01 DIAGNOSIS — M545 Low back pain: Secondary | ICD-10-CM

## 2019-02-01 DIAGNOSIS — E782 Mixed hyperlipidemia: Secondary | ICD-10-CM

## 2019-02-01 DIAGNOSIS — Z9989 Dependence on other enabling machines and devices: Secondary | ICD-10-CM

## 2019-02-01 DIAGNOSIS — G8929 Other chronic pain: Secondary | ICD-10-CM

## 2019-02-01 DIAGNOSIS — G4733 Obstructive sleep apnea (adult) (pediatric): Secondary | ICD-10-CM

## 2019-02-01 MED ORDER — HYDROCHLOROTHIAZIDE 25 MG PO TABS: 25.0000 mg | ORAL_TABLET | Freq: Every day | ORAL | 1 refills | Status: DC

## 2019-02-01 MED ORDER — MELOXICAM 15 MG PO TABS: 15.0000 mg | ORAL_TABLET | Freq: Every day | ORAL | 2 refills | Status: AC

## 2019-02-01 MED ORDER — LOSARTAN POTASSIUM 50 MG PO TABS: 50.0000 mg | ORAL_TABLET | Freq: Every day | ORAL | 1 refills | Status: DC

## 2019-02-01 MED ORDER — METHOCARBAMOL 500 MG PO TABS: ORAL_TABLET | ORAL | 1 refills | Status: DC

## 2019-02-01 MED ORDER — LOVASTATIN 20 MG PO TABS: 20.0000 mg | ORAL_TABLET | Freq: Every morning | ORAL | 3 refills | Status: DC

## 2019-02-01 MED FILL — MELOXICAM 15 MG TABLET: 15 | 30 days supply | Qty: 30 | Fill #1

## 2019-02-01 NOTE — Progress Notes (Signed)
Virtual Visit via Telephone Note  I connected with Myrtie Neitherominick Burel on 02/01/19  at  10:10 AM EDT  EDT by telephone and verified that I am speaking with the correct person using two identifiers.   Consent I discussed the limitations, risks, security and privacy concerns of performing an evaluation and management service by telephone and the availability of in person appointments. I also discussed with the patient that there may be a patient responsible charge related to this service. The patient expressed understanding and agreed to proceed.   Location of Patient: "At the [facility] office with my mother"    [facility]    Persons participating in Telemedicine visit: Bertram DenverZelda  FNP-BC YY LyonsBien CMA Channing Zachery DauerBarnes    History of Present Illness: Follow up for tachycardia. He was instructed a few weeks ago to monitor his heart rate as he had been told his heart rate was "too high" to give plasma. Today he states his heart rate has consistently been in the low 90s.  Essential Hypertension Goes to plasma center twice a week to donate plasma and having his blood pressure monitored. Endorses "good readings" . States "they will not let me give plasma if my blood pressure or heart rate is high or if I have a temperature". Denies chest pain, shortness of breath, palpitations, tachycardia lightheadedness, dizziness, headaches or BLE edema.  Taking HCTZ 25, losartan 50mg  daily as prescribed.  BP Readings from Last 3 Encounters:  12/06/18 128/83  09/22/18 (!) 164/106  05/31/18 (!) 142/87    Chronic Back Pain Persistent right sided low back pain and right sided hip pain. Taking meloxicam and robaxin for pain relief with some improvement. He denies any injury or trauma. Using cane for ambulation at times.   OSA CPAP "drying my mouth out". States he has tried all settings and none of them seem to help relieve his dry mouth at night. Will  refer for mask desensitization study  Observations/Objective: Awake, alert and oriented x3.     Assessment and Plan: Diagnoses and all orders for this visit:  OSA on CPAP -     Desensitization mask fit; Future  Mixed hyperlipidemia -     lovastatin (MEVACOR) 20 MG tablet; Take 1 tablet (20 mg total) by mouth every morning. INSTRUCTIONS: Work on a low fat, heart healthy diet and participate in regular aerobic exercise program by working out at least 150 minutes per week; 5 days a week-30 minutes per day. Avoid red meat, fried foods. junk foods, sodas, sugary drinks, unhealthy snacking, alcohol and smoking.  Drink at least 48oz of water per day and monitor your carbohydrate intake daily.    Essential hypertension -     losartan (COZAAR) 50 MG tablet; Take 1 tablet (50 mg total) by mouth daily. -     hydrochlorothiazide (HYDRODIURIL) 25 MG tablet; Take 1 tablet (25 mg total) by mouth daily. Continue all antihypertensives as prescribed.  Remember to bring in your blood pressure log with you for your follow up appointment.  DASH/Mediterranean Diets are healthier choices for HTN.   Chronic bilateral low back pain without sciatica -     meloxicam (MOBIC) 15 MG tablet; Take 1 tablet (15 mg total) by mouth daily. Work on losing weight to help reduce back pain. May alternate with heat and ice application for pain relief. May also alternate with acetaminophen as prescribed for back pain. Other alternatives include massage, acupuncture and water aerobics.  You must stay  active and avoid a sedentary lifestyle.   Muscle spasm -     methocarbamol (ROBAXIN) 500 MG tablet; 1-2 tablets up to 3 times a day    Follow Up Instructions Return in about 3 months (around 05/03/2019).     I discussed the assessment and treatment plan with the patient. The patient was provided an opportunity to ask questions and all were answered. The patient agreed with the plan and demonstrated an understanding of the  instructions.   The patient was advised to call back or seek an in-person evaluation if the symptoms worsen or if the condition fails to improve as anticipated.  I provided 15 minutes of non-face-to-face time during this encounter including median intraservice time, reviewing previous notes, labs, imaging, medications and explaining diagnosis and management.  Claiborne Rigg, FNP-BC

## 2019-05-05 ENCOUNTER — Ambulatory Visit: Payer: Self-pay | Attending: Nurse Practitioner | Admitting: Physician Assistant

## 2019-05-05 ENCOUNTER — Telehealth: Payer: Self-pay

## 2019-05-05 ENCOUNTER — Other Ambulatory Visit: Payer: Self-pay

## 2019-05-05 DIAGNOSIS — M62830 Muscle spasm of back: Secondary | ICD-10-CM

## 2019-05-05 DIAGNOSIS — M62838 Other muscle spasm: Secondary | ICD-10-CM

## 2019-05-05 MED ORDER — METHOCARBAMOL 500 MG PO TABS: ORAL_TABLET | ORAL | 1 refills | Status: DC

## 2019-05-05 MED ORDER — NAPROXEN 500 MG PO TABS: 500.0000 mg | ORAL_TABLET | Freq: Two times a day (BID) | ORAL | 0 refills | Status: DC

## 2019-05-05 MED FILL — METHOCARBAMOL 500 MG TABS: 500 | 30 days supply | Qty: 90 | Fill #0

## 2019-05-05 MED FILL — NAPROXEN 500 MG TABLET: 500 | 30 days supply | Qty: 60 | Fill #0

## 2019-05-05 NOTE — Progress Notes (Signed)
Pt states his back has been hurting for some years   Pt states the pain radiates down to this right leg

## 2019-05-05 NOTE — Telephone Encounter (Signed)
Message received that patient has questions about his CPAP machine.  Call placed to patient and he said that his mouth is " drying out " from his CPAP machine.  He wanted to know if the settings need to be changed. Informed him that this CM would check with Dr Joya Gaskins.  This CM noticed that a referral had been placed for mask desensitization. Call placed to [facility], spoke to [place]. The patient had been referred for mask desensitization and has not yet been scheduled.  She said that they would contact the patient today.  Please advise regarding further recommendations if any.

## 2019-05-05 NOTE — Progress Notes (Signed)
Virtual Visit via Telephone Note  I connected with Gregory Gay on 05/05/19 at  9:30 AM EDT by telephone and verified that I am speaking with the correct person using two identifiers.   I discussed the limitations, risks, security and privacy concerns of performing an evaluation and management service by telephone and the availability of in person appointments. I also discussed with the patient that there may be a patient responsible charge related to this service. The patient expressed understanding and agreed to proceed.  Patient location:  Home  My Location:  [place] office Persons on the call:  Me and the patient.  History of Present Illness: pain in lower back and radiates into R leg.  R leg seems weaker than usual.  He has been applying for disability.  He denies numbness.  No urinary s/sx.      Observations/Objective: A&Ox3   Assessment and Plan: 1. Muscle spasm - methocarbamol (ROBAXIN) 500 MG tablet; 1-2 tablets up to 3 times a day X 7 days then prn muscle spasm  Dispense: 90 tablet; Refill: 1  2. Lumbar paraspinal muscle spasm with worsening radiculopathy - DG Lumbar Spine Complete; Future - naproxen (NAPROSYN) 500 MG tablet; Take 1 tablet (500 mg total) by mouth 2 (two) times daily with a meal. X 1 week then prn pain  Dispense: 60 tablet; Refill: 0 - methocarbamol (ROBAXIN) 500 MG tablet; 1-2 tablets up to 3 times a day X 7 days then prn muscle spasm  Dispense: 90 tablet; Refill: 1    Follow Up Instructions: With PCP 1-2 months   I discussed the assessment and treatment plan with the patient. The patient was provided an opportunity to ask questions and all were answered. The patient agreed with the plan and demonstrated an understanding of the instructions.   The patient was advised to call back or seek an in-person evaluation if the symptoms worsen or if the condition fails to improve as anticipated.  I provided 12 minutes of non-face-to-face time during this  encounter.   Freeman Caldron, PA-C  Patient ID: Gregory Gay, male   DOB: 19-Apr-1966, 53 y.o.   MRN: [id number]

## 2019-05-05 NOTE — Telephone Encounter (Signed)
Keep mask desensitization appt.  Keep bottle water at bedside.  Hydrate well during day.  Make sure has humidity on cpap

## 2019-05-05 NOTE — Telephone Encounter (Signed)
Attempted to contact the patient # [phone] to inform him of Dr Bettina Gavia recommendations.  Call back requested to this CM # [phone]

## 2019-05-06 ENCOUNTER — Other Ambulatory Visit: Payer: Self-pay | Admitting: Physician Assistant

## 2019-05-06 ENCOUNTER — Ambulatory Visit (HOSPITAL_COMMUNITY)
Admission: RE | Admit: 2019-05-06 | Discharge: 2019-05-06 | Disposition: A | Discharge status: Home / Self Care | Payer: Medicaid Other | Source: Ambulatory Visit | Attending: Physician Assistant | Admitting: Physician Assistant

## 2019-05-06 DIAGNOSIS — M62830 Muscle spasm of back: Secondary | ICD-10-CM | POA: Diagnosis present

## 2019-05-06 DIAGNOSIS — G8929 Other chronic pain: Secondary | ICD-10-CM

## 2019-05-18 NOTE — Telephone Encounter (Signed)
Call placed to patient and informed him of recommendations from Dr Joya Gaskins - keep water bottle at bedside, hydrate during the day and make sure that the humidifier is on the CPAP. He said that he has the water for the machine and also confirmed that he has the mask desensitization scheduled for 05/31/2019 and the COVID screening on 05/27/2019.  Instructed him to contact this office if he has additional questions.

## 2019-05-25 ENCOUNTER — Telehealth: Payer: Self-pay | Admitting: Nurse Practitioner

## 2019-05-25 NOTE — Telephone Encounter (Signed)
Pt came by to drop off disability forms. Placed in pcp inbox,  please fax as directed on the forms and call pt to pickup copies

## 2019-05-26 NOTE — Telephone Encounter (Signed)
Will fax and call patient when ready.

## 2019-05-27 ENCOUNTER — Inpatient Hospital Stay (HOSPITAL_COMMUNITY): Admission: RE | Admit: 2019-05-27 | Payer: Self-pay | Source: Ambulatory Visit

## 2019-05-31 ENCOUNTER — Other Ambulatory Visit (HOSPITAL_BASED_OUTPATIENT_CLINIC_OR_DEPARTMENT_OTHER): Payer: Self-pay | Admitting: Internal Medicine

## 2019-06-08 ENCOUNTER — Emergency Department (HOSPITAL_COMMUNITY): Payer: No Typology Code available for payment source

## 2019-06-08 ENCOUNTER — Encounter (HOSPITAL_COMMUNITY): Payer: Self-pay | Admitting: Emergency Medicine

## 2019-06-08 ENCOUNTER — Emergency Department (HOSPITAL_COMMUNITY)
Admission: EM | Admit: 2019-06-08 | Discharge: 2019-06-09 | Disposition: A | Discharge status: Home / Self Care | Payer: No Typology Code available for payment source | Attending: Emergency Medicine | Admitting: Emergency Medicine

## 2019-06-08 ENCOUNTER — Other Ambulatory Visit: Payer: Self-pay

## 2019-06-08 DIAGNOSIS — S8991XA Unspecified injury of right lower leg, initial encounter: Secondary | ICD-10-CM | POA: Diagnosis not present

## 2019-06-08 DIAGNOSIS — Y998 Other external cause status: Secondary | ICD-10-CM | POA: Diagnosis not present

## 2019-06-08 DIAGNOSIS — S80811A Abrasion, right lower leg, initial encounter: Secondary | ICD-10-CM | POA: Insufficient documentation

## 2019-06-08 DIAGNOSIS — I10 Essential (primary) hypertension: Secondary | ICD-10-CM | POA: Insufficient documentation

## 2019-06-08 DIAGNOSIS — E785 Hyperlipidemia, unspecified: Secondary | ICD-10-CM | POA: Diagnosis not present

## 2019-06-08 DIAGNOSIS — S0990XA Unspecified injury of head, initial encounter: Secondary | ICD-10-CM | POA: Insufficient documentation

## 2019-06-08 DIAGNOSIS — Y92410 Unspecified street and highway as the place of occurrence of the external cause: Secondary | ICD-10-CM | POA: Diagnosis not present

## 2019-06-08 DIAGNOSIS — R52 Pain, unspecified: Secondary | ICD-10-CM

## 2019-06-08 DIAGNOSIS — S0501XA Injury of conjunctiva and corneal abrasion without foreign body, right eye, initial encounter: Secondary | ICD-10-CM | POA: Diagnosis not present

## 2019-06-08 DIAGNOSIS — S301XXA Contusion of abdominal wall, initial encounter: Secondary | ICD-10-CM | POA: Diagnosis not present

## 2019-06-08 DIAGNOSIS — F17210 Nicotine dependence, cigarettes, uncomplicated: Secondary | ICD-10-CM | POA: Insufficient documentation

## 2019-06-08 DIAGNOSIS — Y93I9 Activity, other involving external motion: Secondary | ICD-10-CM | POA: Diagnosis not present

## 2019-06-08 DIAGNOSIS — S59902A Unspecified injury of left elbow, initial encounter: Secondary | ICD-10-CM | POA: Diagnosis present

## 2019-06-08 DIAGNOSIS — Z23 Encounter for immunization: Secondary | ICD-10-CM | POA: Insufficient documentation

## 2019-06-08 DIAGNOSIS — G44319 Acute post-traumatic headache, not intractable: Secondary | ICD-10-CM

## 2019-06-08 LAB — CBC
HCT: 43.9 % (ref 39.0–52.0)
Hemoglobin: 14.6 g/dL (ref 13.0–17.0)
MCH: 32.2 pg (ref 26.0–34.0)
MCHC: 33.3 g/dL (ref 30.0–36.0)
MCV: 96.7 fL (ref 80.0–100.0)
Platelets: 267 10*3/uL (ref 150–400)
RBC: 4.54 MIL/uL (ref 4.22–5.81)
RDW: 11.7 % (ref 11.5–15.5)
WBC: 12.3 10*3/uL — ABNORMAL HIGH (ref 4.0–10.5)
nRBC: 0 % (ref 0.0–0.2)

## 2019-06-08 MED ORDER — SODIUM CHLORIDE 0.9 % IV BOLUS
1000.0000 mL | Freq: Once | INTRAVENOUS | Status: AC
  Administered 2019-06-08: 1000 mL via INTRAVENOUS

## 2019-06-08 MED ORDER — FLUORESCEIN SODIUM 1 MG OP STRP
2.0000 | ORAL_STRIP | Freq: Once | OPHTHALMIC | Status: AC
  Administered 2019-06-09: 2 via OPHTHALMIC
  Filled 2019-06-08: qty 2

## 2019-06-08 MED ORDER — HYDROMORPHONE HCL 1 MG/ML IJ SOLN
1.0000 mg | Freq: Once | INTRAMUSCULAR | Status: AC
  Administered 2019-06-08: 1 mg via INTRAVENOUS
  Filled 2019-06-08: qty 1

## 2019-06-08 MED ORDER — TETRACAINE HCL 0.5 % OP SOLN
2.0000 [drp] | Freq: Once | OPHTHALMIC | Status: AC
  Administered 2019-06-09: 2 [drp] via OPHTHALMIC
  Filled 2019-06-08: qty 4

## 2019-06-08 MED ORDER — TETANUS-DIPHTH-ACELL PERTUSSIS 5-2.5-18.5 LF-MCG/0.5 IM SUSP
0.5000 mL | Freq: Once | INTRAMUSCULAR | Status: AC
  Administered 2019-06-09: 04:00:00 0.5 mL via INTRAMUSCULAR
  Filled 2019-06-08: qty 0.5
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
## 2019-06-09 ENCOUNTER — Emergency Department (HOSPITAL_COMMUNITY): Payer: No Typology Code available for payment source

## 2019-06-09 ENCOUNTER — Encounter (HOSPITAL_COMMUNITY): Payer: Self-pay | Admitting: Radiology

## 2019-06-09 DIAGNOSIS — S301XXA Contusion of abdominal wall, initial encounter: Secondary | ICD-10-CM | POA: Diagnosis not present

## 2019-06-09 LAB — TYPE AND SCREEN
ABO/RH(D): O POS
Antibody Screen: NEGATIVE

## 2019-06-09 LAB — CK TOTAL AND CKMB (NOT AT ARMC)
CK, MB: 2.9 ng/mL (ref 0.5–5.0)
Relative Index: 1.9 (ref 0.0–2.5)
Total CK: 155 U/L (ref 49–397)

## 2019-06-09 LAB — I-STAT CHEM 8, ED
BUN: 18 mg/dL (ref 6–20)
Calcium, Ion: 1.21 mmol/L (ref 1.15–1.40)
Chloride: 105 mmol/L (ref 98–111)
Creatinine, Ser: 1 mg/dL (ref 0.61–1.24)
Glucose, Bld: 91 mg/dL (ref 70–99)
HCT: 45 % (ref 39.0–52.0)
Hemoglobin: 15.3 g/dL (ref 13.0–17.0)
Potassium: 3.2 mmol/L — ABNORMAL LOW (ref 3.5–5.1)
Sodium: 142 mmol/L (ref 135–145)
TCO2: 25 mmol/L (ref 22–32)

## 2019-06-09 LAB — COMPREHENSIVE METABOLIC PANEL
ALT: 17 U/L (ref 0–44)
AST: 20 U/L (ref 15–41)
Albumin: 4 g/dL (ref 3.5–5.0)
Alkaline Phosphatase: 54 U/L (ref 38–126)
Anion gap: 10 (ref 5–15)
BUN: 16 mg/dL (ref 6–20)
CO2: 24 mmol/L (ref 22–32)
Calcium: 9.1 mg/dL (ref 8.9–10.3)
Chloride: 107 mmol/L (ref 98–111)
Creatinine, Ser: 1.09 mg/dL (ref 0.61–1.24)
GFR calc Af Amer: >60 mL/min (ref >60)
GFR calc non Af Amer: >60 mL/min (ref >60)
Glucose, Bld: 99 mg/dL (ref 70–99)
Potassium: 3.3 mmol/L — ABNORMAL LOW (ref 3.5–5.1)
Sodium: 141 mmol/L (ref 135–145)
Total Bilirubin: 1.3 mg/dL — ABNORMAL HIGH (ref 0.3–1.2)
Total Protein: 6.3 g/dL — ABNORMAL LOW (ref 6.5–8.1)

## 2019-06-09 LAB — ABO/RH: ABO/RH(D): O POS

## 2019-06-09 MED ORDER — METHOCARBAMOL 500 MG PO TABS: 500.0000 mg | ORAL_TABLET | Freq: Two times a day (BID) | ORAL | 0 refills | Status: DC

## 2019-06-09 MED ORDER — OXYCODONE-ACETAMINOPHEN 5-325 MG PO TABS
1.0000 | ORAL_TABLET | Freq: Once | ORAL | Status: AC
  Administered 2019-06-09: 04:00:00 1 via ORAL
  Filled 2019-06-09: qty 1

## 2019-06-09 MED ORDER — OXYCODONE-ACETAMINOPHEN 5-325 MG PO TABS: 1.0000 | ORAL_TABLET | Freq: Three times a day (TID) | ORAL | 0 refills | Status: DC | PRN

## 2019-06-09 MED ORDER — IOHEXOL 300 MG/ML  SOLN
100.0000 mL | Freq: Once | INTRAMUSCULAR | Status: AC | PRN
  Administered 2019-06-09: 100 mL via INTRAVENOUS

## 2019-06-09 MED ORDER — METHOCARBAMOL 500 MG PO TABS: 500.0000 mg | ORAL_TABLET | Freq: Two times a day (BID) | ORAL | 0 refills | Status: AC

## 2019-06-09 MED ORDER — ERYTHROMYCIN 5 MG/GM OP OINT
1.0000 "application " | TOPICAL_OINTMENT | Freq: Once | OPHTHALMIC | Status: AC
  Administered 2019-06-09: 1 via OPHTHALMIC
  Filled 2019-06-09: qty 3.5

## 2019-06-09 NOTE — ED Notes (Signed)
Pt. ambulated to restroom with no difficulties. C/o of stiffness.
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
## 2019-06-10 ENCOUNTER — Other Ambulatory Visit (HOSPITAL_COMMUNITY): Admission: RE | Admit: 2019-06-10 | Payer: No Typology Code available for payment source | Source: Ambulatory Visit

## 2019-06-11 ENCOUNTER — Other Ambulatory Visit: Payer: Self-pay

## 2019-06-11 ENCOUNTER — Emergency Department (HOSPITAL_COMMUNITY)
Admission: EM | Admit: 2019-06-11 | Discharge: 2019-06-11 | Disposition: A | Discharge status: Home / Self Care | Payer: No Typology Code available for payment source | Attending: Emergency Medicine | Admitting: Emergency Medicine

## 2019-06-11 ENCOUNTER — Encounter (HOSPITAL_COMMUNITY): Payer: Self-pay

## 2019-06-11 ENCOUNTER — Inpatient Hospital Stay (HOSPITAL_COMMUNITY): Admission: RE | Admit: 2019-06-11 | Payer: Self-pay | Source: Ambulatory Visit

## 2019-06-11 DIAGNOSIS — Z79899 Other long term (current) drug therapy: Secondary | ICD-10-CM | POA: Insufficient documentation

## 2019-06-11 DIAGNOSIS — I10 Essential (primary) hypertension: Secondary | ICD-10-CM | POA: Insufficient documentation

## 2019-06-11 DIAGNOSIS — S301XXD Contusion of abdominal wall, subsequent encounter: Secondary | ICD-10-CM | POA: Diagnosis not present

## 2019-06-11 DIAGNOSIS — F17210 Nicotine dependence, cigarettes, uncomplicated: Secondary | ICD-10-CM | POA: Diagnosis not present

## 2019-06-11 MED ORDER — OXYCODONE-ACETAMINOPHEN 5-325 MG PO TABS
2.0000 | ORAL_TABLET | Freq: Once | ORAL | Status: AC
  Administered 2019-06-11: 2 via ORAL
  Filled 2019-06-11: qty 2

## 2019-06-11 MED ORDER — NAPROXEN 250 MG PO TABS
500.0000 mg | ORAL_TABLET | Freq: Once | ORAL | Status: AC
  Administered 2019-06-11: 500 mg via ORAL
  Filled 2019-06-11: qty 2

## 2019-06-11 MED ORDER — OXYCODONE-ACETAMINOPHEN 5-325 MG PO TABS: 1.0000 | ORAL_TABLET | Freq: Four times a day (QID) | ORAL | 0 refills | Status: DC | PRN

## 2019-06-11 MED ORDER — NAPROXEN 500 MG PO TABS: 500.0000 mg | ORAL_TABLET | Freq: Two times a day (BID) | ORAL | 0 refills | Status: DC
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
## 2019-06-11 NOTE — Discharge Instructions (Signed)
Take Percocet 1 to 2 tablets every 6 hours, take this regularly and do not miss any doses, take naproxen every 12 hours.  You can also use hot and cool compresses over your lower abdomen to help with pain.  Please follow-up with your primary doctor next week for recheck to ensure pain is improving.  Your exam today is reassuring I think if you stay on top of your pain medication your pain will be better controlled and you will continue to improve.
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
## 2019-06-13 ENCOUNTER — Other Ambulatory Visit (HOSPITAL_COMMUNITY): Admission: RE | Admit: 2019-06-13 | Payer: No Typology Code available for payment source | Source: Ambulatory Visit
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
## 2019-06-14 ENCOUNTER — Other Ambulatory Visit (HOSPITAL_BASED_OUTPATIENT_CLINIC_OR_DEPARTMENT_OTHER): Payer: Self-pay | Admitting: Internal Medicine

## 2019-07-06 ENCOUNTER — Other Ambulatory Visit: Payer: Self-pay

## 2019-07-06 ENCOUNTER — Encounter: Payer: Self-pay | Admitting: Critical Care Medicine

## 2019-07-06 ENCOUNTER — Ambulatory Visit: Payer: Self-pay | Attending: Critical Care Medicine | Admitting: Critical Care Medicine

## 2019-07-06 VITALS — BP 144/95 | HR 76 | Temp 98.2°F | Resp 18 | Ht 66.0 in | Wt 197.0 lb

## 2019-07-06 DIAGNOSIS — F17200 Nicotine dependence, unspecified, uncomplicated: Secondary | ICD-10-CM

## 2019-07-06 DIAGNOSIS — M545 Low back pain: Secondary | ICD-10-CM

## 2019-07-06 DIAGNOSIS — G8929 Other chronic pain: Secondary | ICD-10-CM

## 2019-07-06 DIAGNOSIS — M461 Sacroiliitis, not elsewhere classified: Secondary | ICD-10-CM

## 2019-07-06 DIAGNOSIS — Z1211 Encounter for screening for malignant neoplasm of colon: Secondary | ICD-10-CM

## 2019-07-06 DIAGNOSIS — G4733 Obstructive sleep apnea (adult) (pediatric): Secondary | ICD-10-CM

## 2019-07-06 DIAGNOSIS — L02419 Cutaneous abscess of limb, unspecified: Secondary | ICD-10-CM

## 2019-07-06 DIAGNOSIS — M940 Chondrocostal junction syndrome [Tietze]: Secondary | ICD-10-CM

## 2019-07-06 DIAGNOSIS — L03119 Cellulitis of unspecified part of limb: Secondary | ICD-10-CM

## 2019-07-06 DIAGNOSIS — I10 Essential (primary) hypertension: Secondary | ICD-10-CM

## 2019-07-06 DIAGNOSIS — E782 Mixed hyperlipidemia: Secondary | ICD-10-CM

## 2019-07-06 MED ORDER — HYDROCHLOROTHIAZIDE 25 MG PO TABS: 25.0000 mg | ORAL_TABLET | Freq: Every day | ORAL | 1 refills | Status: DC

## 2019-07-06 MED ORDER — LOSARTAN POTASSIUM 50 MG PO TABS: 50.0000 mg | ORAL_TABLET | Freq: Every day | ORAL | 1 refills | Status: DC

## 2019-07-06 MED ORDER — LOVASTATIN 20 MG PO TABS: 20.0000 mg | ORAL_TABLET | Freq: Every morning | ORAL | 3 refills | Status: DC

## 2019-07-06 MED ORDER — MELOXICAM 15 MG PO TABS: 15.0000 mg | ORAL_TABLET | Freq: Every day | ORAL | 1 refills | Status: DC

## 2019-07-06 MED ORDER — CEPHALEXIN 500 MG PO CAPS: 500.0000 mg | ORAL_CAPSULE | Freq: Three times a day (TID) | ORAL | 0 refills | Status: AC

## 2019-07-06 MED FILL — LOSARTAN POTASSIUM 50 MG TA: 50 | 30 days supply | Qty: 30 | Fill #0

## 2019-07-06 MED FILL — MELOXICAM 15 MG TABLET: 15 | 30 days supply | Qty: 30 | Fill #0

## 2019-07-06 MED FILL — HYDROCHLOROTHIAZIDE 25 MG T: 25 | 30 days supply | Qty: 30 | Fill #0

## 2019-07-06 MED FILL — LOVASTATIN 20 MG TABS: 20 | 30 days supply | Qty: 30 | Fill #0

## 2019-07-06 MED FILL — CEPHALEXIN 500 MG CAPSULE: 500 | 5 days supply | Qty: 15 | Fill #0

## 2019-07-06 NOTE — Assessment & Plan Note (Signed)
Ongoing costochondritis for this will prescribe meloxicam
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
## 2019-07-06 NOTE — Assessment & Plan Note (Signed)
Obstructive sleep apnea per Dr. Annamaria Boots needs a CPAP machine re-serviced will connect with case management for this
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
## 2019-07-06 NOTE — Patient Instructions (Signed)
Begin meloxicam 15 mg daily for your sternum and hip pain  Take Keflex 500 mg 3 times a day for 5 days for your right lower extremity cellulitis infection  Refills on all your other medications sent to our pharmacy  Flu vaccine was given  We given you a stool card to collect a stool sample and mail that back into screen for colon cancer  Continue your chiropractic treatments and if this does not work we can send you to orthopedics or physical therapy in the future  Return 2 months with Ms. Raul Del for follow-up

## 2019-07-06 NOTE — Assessment & Plan Note (Signed)
I counseled this patient on smoking cessation 

## 2019-07-06 NOTE — Assessment & Plan Note (Signed)
Hypertension under adequate control at this time  No change in blood pressure medications and refills were sent in on the hydrochlorthiazide and losartan

## 2019-07-06 NOTE — Assessment & Plan Note (Signed)
Will send fecal occult blood for colon cancer screening

## 2019-09-06 ENCOUNTER — Ambulatory Visit: Payer: Self-pay | Admitting: Nurse Practitioner

## 2019-09-17 ENCOUNTER — Other Ambulatory Visit: Payer: Self-pay

## 2019-09-17 DIAGNOSIS — Z20822 Contact with and (suspected) exposure to covid-19: Secondary | ICD-10-CM

## 2019-09-20 LAB — NOVEL CORONAVIRUS, NAA: SARS-CoV-2, NAA: NOT DETECTED

## 2019-10-04 ENCOUNTER — Encounter: Payer: Self-pay | Admitting: Nurse Practitioner

## 2019-10-04 ENCOUNTER — Other Ambulatory Visit: Payer: Self-pay

## 2019-10-04 ENCOUNTER — Ambulatory Visit: Payer: Medicaid Other | Attending: Nurse Practitioner | Admitting: Nurse Practitioner

## 2019-10-04 DIAGNOSIS — D72829 Elevated white blood cell count, unspecified: Secondary | ICD-10-CM

## 2019-10-04 DIAGNOSIS — E782 Mixed hyperlipidemia: Secondary | ICD-10-CM

## 2019-10-04 DIAGNOSIS — M62830 Muscle spasm of back: Secondary | ICD-10-CM

## 2019-10-04 DIAGNOSIS — I10 Essential (primary) hypertension: Secondary | ICD-10-CM

## 2019-10-04 MED ORDER — LOVASTATIN 20 MG PO TABS: 20.0000 mg | ORAL_TABLET | Freq: Every morning | ORAL | 3 refills | Status: DC

## 2019-10-04 MED ORDER — LOSARTAN POTASSIUM 50 MG PO TABS: 50.0000 mg | ORAL_TABLET | Freq: Every day | ORAL | 1 refills | Status: DC

## 2019-10-04 MED ORDER — HYDROCHLOROTHIAZIDE 25 MG PO TABS: 25.0000 mg | ORAL_TABLET | Freq: Every day | ORAL | 1 refills | Status: DC

## 2019-10-04 MED ORDER — NAPROXEN 500 MG PO TABS: 500.0000 mg | ORAL_TABLET | Freq: Two times a day (BID) | ORAL | 1 refills | Status: DC

## 2019-10-04 MED FILL — LOSARTAN POTASSIUM 50 MG TA: 50 | 30 days supply | Qty: 30 | Fill #0

## 2019-10-04 MED FILL — NAPROXEN 500 MG TABLET: 500 | 30 days supply | Qty: 60 | Fill #0

## 2019-10-04 MED FILL — HYDROCHLOROTHIAZIDE 25 MG T: 25 | 30 days supply | Qty: 30 | Fill #0

## 2019-10-04 MED FILL — LOVASTATIN 20 MG TABS: 20 | 30 days supply | Qty: 30 | Fill #0
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
## 2019-10-05 ENCOUNTER — Other Ambulatory Visit: Payer: Self-pay

## 2019-10-05 ENCOUNTER — Ambulatory Visit: Payer: Medicaid Other | Attending: Nurse Practitioner

## 2019-10-05 DIAGNOSIS — I10 Essential (primary) hypertension: Secondary | ICD-10-CM

## 2019-10-05 DIAGNOSIS — D72829 Elevated white blood cell count, unspecified: Secondary | ICD-10-CM

## 2019-10-05 DIAGNOSIS — E782 Mixed hyperlipidemia: Secondary | ICD-10-CM

## 2019-10-06 LAB — CMP14+EGFR
ALT: 11 IU/L (ref 0–44)
AST: 21 IU/L (ref 0–40)
Albumin/Globulin Ratio: 2.3 — ABNORMAL HIGH (ref 1.2–2.2)
Albumin: 4.4 g/dL (ref 3.8–4.9)
Alkaline Phosphatase: 92 IU/L (ref 39–117)
BUN/Creatinine Ratio: 10 (ref 9–20)
BUN: 13 mg/dL (ref 6–24)
Bilirubin Total: 0.9 mg/dL (ref 0.0–1.2)
CO2: 24 mmol/L (ref 20–29)
Calcium: 9.8 mg/dL (ref 8.7–10.2)
Chloride: 103 mmol/L (ref 96–106)
Creatinine, Ser: 1.24 mg/dL (ref 0.76–1.27)
GFR calc Af Amer: 76 mL/min/{1.73_m2} (ref >59)
GFR calc non Af Amer: 66 mL/min/{1.73_m2} (ref >59)
Globulin, Total: 1.9 g/dL (ref 1.5–4.5)
Glucose: 88 mg/dL (ref 65–99)
Potassium: 4.5 mmol/L (ref 3.5–5.2)
Sodium: 141 mmol/L (ref 134–144)
Total Protein: 6.3 g/dL (ref 6.0–8.5)

## 2019-10-06 LAB — CBC
Hematocrit: 44.9 % (ref 37.5–51.0)
Hemoglobin: 14.9 g/dL (ref 13.0–17.7)
MCH: 32.7 pg (ref 26.6–33.0)
MCHC: 33.2 g/dL (ref 31.5–35.7)
MCV: 99 fL — ABNORMAL HIGH (ref 79–97)
Platelets: 256 10*3/uL (ref 150–450)
RBC: 4.56 x10E6/uL (ref 4.14–5.80)
RDW: 11.6 % (ref 11.6–15.4)
WBC: 8.2 10*3/uL (ref 3.4–10.8)

## 2019-10-06 LAB — LIPID PANEL
Chol/HDL Ratio: 3.8 ratio (ref 0.0–5.0)
Cholesterol, Total: 196 mg/dL (ref 100–199)
HDL: 51 mg/dL (ref >39)
LDL Chol Calc (NIH): 117 mg/dL — ABNORMAL HIGH (ref 0–99)
Triglycerides: 161 mg/dL — ABNORMAL HIGH (ref 0–149)
VLDL Cholesterol Cal: 28 mg/dL (ref 5–40)

## 2019-10-07 ENCOUNTER — Encounter: Payer: Self-pay | Admitting: Nurse Practitioner

## 2019-10-11 NOTE — Progress Notes (Signed)
Please be sure to share with patient if you have not already

## 2019-10-28 ENCOUNTER — Telehealth: Payer: Self-pay | Admitting: Nurse Practitioner

## 2019-10-28 MED FILL — LOVASTATIN 20 MG TABS: 20 | 90 days supply | Qty: 90 | Fill #0

## 2019-10-28 MED FILL — HYDROCHLOROTHIAZIDE 25 MG T: 25 | 90 days supply | Qty: 90 | Fill #0

## 2019-10-28 MED FILL — NAPROXEN 500 MG TABLET: 500 | 30 days supply | Qty: 60 | Fill #0

## 2019-10-28 MED FILL — LOSARTAN POTASSIUM 50 MG TA: 50 | 90 days supply | Qty: 90 | Fill #0

## 2019-10-28 NOTE — Telephone Encounter (Signed)
Patient came in requesting a handicap parking placard. Patient filled out the form and it will be placed in providers box. Patient was informed that once form has been completed the nurse would give him a call. Please f/u.

## 2019-10-28 NOTE — Telephone Encounter (Signed)
Will call patient when it is ready.  

## 2019-11-15 NOTE — Telephone Encounter (Signed)
Attempt to reach patient to inform his handicap placard is ready for pick.  LVM, CMA will place it at the front desk.

## 2020-01-19 ENCOUNTER — Ambulatory Visit: Payer: Medicaid Other | Attending: Internal Medicine

## 2020-01-19 DIAGNOSIS — Z23 Encounter for immunization: Secondary | ICD-10-CM

## 2020-01-19 NOTE — Progress Notes (Signed)
   Covid-19 Vaccination Clinic  Name:  Jhalen Eley    MRN: [id number] DOB: 12-16-1965  01/19/2020  Mr. Khamis was observed post Covid-19 immunization for 15 minutes without incident. He was provided with Vaccine Information Sheet and instruction to access the V-Safe system.   Mr. Poyer was instructed to call 911 with any severe reactions post vaccine: Marland Kitchen Difficulty breathing  . Swelling of face and throat  . A fast heartbeat  . A bad rash all over body  . Dizziness and weakness   Immunizations Administered    Name Date Dose VIS Date Route   Pfizer COVID-19 Vaccine 01/19/2020  3:44 PM 0.3 mL 09/23/2019 Intramuscular   Manufacturer: [employer], [employer]   Lot: [id number]   NDC: [address]-9

## 2020-02-14 ENCOUNTER — Ambulatory Visit: Payer: Medicaid Other

## 2020-04-05 ENCOUNTER — Telehealth: Payer: Self-pay | Admitting: Nurse Practitioner

## 2020-04-05 NOTE — Telephone Encounter (Signed)
Patient stopped by the office and dropped off

## 2020-04-05 NOTE — Telephone Encounter (Signed)
Continued. A form from [employer] for pcp to review and sign. Patient was given medical records when he dropped form off. Patient also turned in a parking placard form for pcp to fill out also. Please follow up at your earliest convenience.

## 2020-04-09 NOTE — Telephone Encounter (Signed)
Received the form. Will contact patient when is ready.

## 2020-04-12 DIAGNOSIS — Z419 Encounter for procedure for purposes other than remedying health state, unspecified: Secondary | ICD-10-CM | POA: Diagnosis not present

## 2020-04-12 NOTE — Telephone Encounter (Signed)
Attempt to reach patient to inform we had filled out a handicap placard back in April. If he needs it, we can print out the copy for him.  His [employer] form he needs to fill out a release medical part for [place] to fill it out. No answer and Patient's mailbox is full.

## 2020-05-13 DIAGNOSIS — Z419 Encounter for procedure for purposes other than remedying health state, unspecified: Secondary | ICD-10-CM | POA: Diagnosis not present

## 2020-05-29 ENCOUNTER — Encounter: Payer: Self-pay | Admitting: Nurse Practitioner

## 2020-05-29 ENCOUNTER — Ambulatory Visit: Payer: Medicaid Other | Attending: Nurse Practitioner | Admitting: Nurse Practitioner

## 2020-05-29 ENCOUNTER — Other Ambulatory Visit: Payer: Self-pay

## 2020-05-29 VITALS — BP 142/76 | HR 93 | Temp 97.7°F | Ht 66.0 in | Wt 188.0 lb

## 2020-05-29 DIAGNOSIS — M25551 Pain in right hip: Secondary | ICD-10-CM

## 2020-05-29 DIAGNOSIS — I10 Essential (primary) hypertension: Secondary | ICD-10-CM | POA: Diagnosis not present

## 2020-05-29 DIAGNOSIS — G4730 Sleep apnea, unspecified: Secondary | ICD-10-CM | POA: Diagnosis not present

## 2020-05-29 DIAGNOSIS — E782 Mixed hyperlipidemia: Secondary | ICD-10-CM | POA: Diagnosis not present

## 2020-05-29 DIAGNOSIS — R000 Tachycardia, unspecified: Secondary | ICD-10-CM | POA: Diagnosis not present

## 2020-05-29 MED ORDER — LOVASTATIN 20 MG PO TABS: 20.0000 mg | ORAL_TABLET | Freq: Every morning | ORAL | 3 refills | Status: DC

## 2020-05-29 MED ORDER — DICLOFENAC SODIUM 75 MG PO TBEC: 75.0000 mg | DELAYED_RELEASE_TABLET | Freq: Two times a day (BID) | ORAL | 1 refills | Status: AC

## 2020-05-29 MED ORDER — LOSARTAN POTASSIUM 50 MG PO TABS: 50.0000 mg | ORAL_TABLET | Freq: Every day | ORAL | 1 refills | Status: DC

## 2020-05-29 MED ORDER — HYDROCHLOROTHIAZIDE 25 MG PO TABS: 25.0000 mg | ORAL_TABLET | Freq: Every day | ORAL | 1 refills | Status: DC

## 2020-05-29 MED FILL — LOSARTAN POTASSIUM 50 MG TA: 50 | 90 days supply | Qty: 90 | Fill #0

## 2020-05-29 MED FILL — HYDROCHLOROTHIAZIDE 25 MG T: 25 | 90 days supply | Qty: 90 | Fill #0

## 2020-05-29 MED FILL — LOVASTATIN 20 MG TABS: 20 | 90 days supply | Qty: 90 | Fill #0

## 2020-05-29 MED FILL — DICLOFENAC SOD EC 75 MG TAB: 75 | 14 days supply | Qty: 28 | Fill #0
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
## 2020-05-30 ENCOUNTER — Ambulatory Visit (HOSPITAL_COMMUNITY)
Admission: RE | Admit: 2020-05-30 | Discharge: 2020-05-30 | Disposition: A | Discharge status: Home / Self Care | Payer: Medicaid Other | Source: Ambulatory Visit | Attending: Nurse Practitioner | Admitting: Nurse Practitioner

## 2020-05-30 ENCOUNTER — Encounter: Payer: Self-pay | Admitting: Nurse Practitioner

## 2020-05-30 DIAGNOSIS — M1611 Unilateral primary osteoarthritis, right hip: Secondary | ICD-10-CM | POA: Diagnosis not present

## 2020-05-30 DIAGNOSIS — M25551 Pain in right hip: Secondary | ICD-10-CM | POA: Insufficient documentation

## 2020-05-30 LAB — CBC
Hematocrit: 46.4 % (ref 37.5–51.0)
Hemoglobin: 15.7 g/dL (ref 13.0–17.7)
MCH: 32.7 pg (ref 26.6–33.0)
MCHC: 33.8 g/dL (ref 31.5–35.7)
MCV: 97 fL (ref 79–97)
Platelets: 282 10*3/uL (ref 150–450)
RBC: 4.8 x10E6/uL (ref 4.14–5.80)
RDW: 11.5 % — ABNORMAL LOW (ref 11.6–15.4)
WBC: 8.7 10*3/uL (ref 3.4–10.8)

## 2020-05-30 LAB — CMP14+EGFR
ALT: 16 IU/L (ref 0–44)
AST: 17 IU/L (ref 0–40)
Albumin/Globulin Ratio: 2.4 — ABNORMAL HIGH (ref 1.2–2.2)
Albumin: 4.6 g/dL (ref 3.8–4.9)
Alkaline Phosphatase: 72 IU/L (ref 48–121)
BUN/Creatinine Ratio: 11 (ref 9–20)
BUN: 12 mg/dL (ref 6–24)
Bilirubin Total: 1.3 mg/dL — ABNORMAL HIGH (ref 0.0–1.2)
CO2: 24 mmol/L (ref 20–29)
Calcium: 9.5 mg/dL (ref 8.7–10.2)
Chloride: 104 mmol/L (ref 96–106)
Creatinine, Ser: 1.05 mg/dL (ref 0.76–1.27)
GFR calc Af Amer: 93 mL/min/{1.73_m2} (ref >59)
GFR calc non Af Amer: 80 mL/min/{1.73_m2} (ref >59)
Globulin, Total: 1.9 g/dL (ref 1.5–4.5)
Glucose: 99 mg/dL (ref 65–99)
Potassium: 3.7 mmol/L (ref 3.5–5.2)
Sodium: 142 mmol/L (ref 134–144)
Total Protein: 6.5 g/dL (ref 6.0–8.5)

## 2020-05-30 LAB — LIPID PANEL
Chol/HDL Ratio: 3.2 ratio (ref 0.0–5.0)
Cholesterol, Total: 165 mg/dL (ref 100–199)
HDL: 52 mg/dL (ref >39)
LDL Chol Calc (NIH): 77 mg/dL (ref 0–99)
Triglycerides: 217 mg/dL — ABNORMAL HIGH (ref 0–149)
VLDL Cholesterol Cal: 36 mg/dL (ref 5–40)

## 2020-06-03 ENCOUNTER — Encounter: Payer: Self-pay | Admitting: Nurse Practitioner

## 2020-06-03 MED ORDER — BLOOD PRESSURE MONITOR DEVI: 0 refills | Status: AC

## 2020-06-07 ENCOUNTER — Ambulatory Visit: Payer: Medicaid Other | Admitting: Surgical

## 2020-06-08 ENCOUNTER — Ambulatory Visit: Payer: Medicaid Other | Admitting: Surgical

## 2020-06-13 DIAGNOSIS — Z419 Encounter for procedure for purposes other than remedying health state, unspecified: Secondary | ICD-10-CM | POA: Diagnosis not present

## 2020-06-14 ENCOUNTER — Emergency Department (HOSPITAL_COMMUNITY): Payer: Medicaid Other

## 2020-06-14 ENCOUNTER — Emergency Department (HOSPITAL_COMMUNITY)
Admission: EM | Admit: 2020-06-14 | Discharge: 2020-06-15 | Discharge status: Against Medical Advice | Payer: Medicaid Other | Attending: Emergency Medicine | Admitting: Emergency Medicine

## 2020-06-14 ENCOUNTER — Other Ambulatory Visit: Payer: Self-pay

## 2020-06-14 ENCOUNTER — Encounter (HOSPITAL_COMMUNITY): Payer: Self-pay | Admitting: Pediatrics

## 2020-06-14 DIAGNOSIS — Z5321 Procedure and treatment not carried out due to patient leaving prior to being seen by health care provider: Secondary | ICD-10-CM | POA: Diagnosis not present

## 2020-06-14 DIAGNOSIS — M545 Low back pain: Secondary | ICD-10-CM | POA: Insufficient documentation

## 2020-06-14 MED ORDER — OXYCODONE-ACETAMINOPHEN 5-325 MG PO TABS
1.0000 | ORAL_TABLET | ORAL | Status: DC | PRN
  Administered 2020-06-14: 1 via ORAL
  Filled 2020-06-14: qty 1

## 2020-06-14 NOTE — ED Triage Notes (Signed)
C/O severe back pain and endorsed hx of disc issues.

## 2020-06-15 NOTE — ED Notes (Signed)
Pt called x 3 with no answer

## 2020-06-29 ENCOUNTER — Ambulatory Visit (INDEPENDENT_AMBULATORY_CARE_PROVIDER_SITE_OTHER): Payer: Medicaid Other | Admitting: Surgical

## 2020-06-29 DIAGNOSIS — M1611 Unilateral primary osteoarthritis, right hip: Secondary | ICD-10-CM

## 2020-07-04 ENCOUNTER — Ambulatory Visit: Payer: Self-pay

## 2020-07-04 ENCOUNTER — Other Ambulatory Visit: Payer: Self-pay

## 2020-07-04 ENCOUNTER — Encounter: Payer: Self-pay | Admitting: Family Medicine

## 2020-07-04 ENCOUNTER — Ambulatory Visit (INDEPENDENT_AMBULATORY_CARE_PROVIDER_SITE_OTHER): Payer: Medicaid Other | Admitting: Family Medicine

## 2020-07-04 DIAGNOSIS — M533 Sacrococcygeal disorders, not elsewhere classified: Secondary | ICD-10-CM

## 2020-07-04 DIAGNOSIS — G8929 Other chronic pain: Secondary | ICD-10-CM | POA: Diagnosis not present

## 2020-07-04 DIAGNOSIS — M62830 Muscle spasm of back: Secondary | ICD-10-CM
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
## 2020-07-06 ENCOUNTER — Encounter: Payer: Self-pay | Admitting: Surgical

## 2020-07-10 ENCOUNTER — Ambulatory Visit (HOSPITAL_BASED_OUTPATIENT_CLINIC_OR_DEPARTMENT_OTHER): Payer: No Typology Code available for payment source | Attending: Nurse Practitioner | Admitting: Internal Medicine

## 2020-07-13 DIAGNOSIS — Z419 Encounter for procedure for purposes other than remedying health state, unspecified: Secondary | ICD-10-CM | POA: Diagnosis not present

## 2020-07-27 ENCOUNTER — Other Ambulatory Visit: Payer: Self-pay

## 2020-07-27 ENCOUNTER — Ambulatory Visit: Payer: Medicaid Other | Admitting: Orthopedic Surgery

## 2020-08-13 DIAGNOSIS — Z419 Encounter for procedure for purposes other than remedying health state, unspecified: Secondary | ICD-10-CM | POA: Diagnosis not present

## 2020-09-12 DIAGNOSIS — Z419 Encounter for procedure for purposes other than remedying health state, unspecified: Secondary | ICD-10-CM | POA: Diagnosis not present

## 2020-10-11 ENCOUNTER — Ambulatory Visit (INDEPENDENT_AMBULATORY_CARE_PROVIDER_SITE_OTHER): Payer: Medicaid Other | Admitting: Surgical

## 2020-10-11 DIAGNOSIS — M1611 Unilateral primary osteoarthritis, right hip: Secondary | ICD-10-CM | POA: Diagnosis not present

## 2020-10-11 DIAGNOSIS — M25551 Pain in right hip: Secondary | ICD-10-CM

## 2020-10-11 MED ORDER — ACETAMINOPHEN-CODEINE #3 300-30 MG PO TABS: 1.0000 | ORAL_TABLET | Freq: Two times a day (BID) | ORAL | 0 refills | Status: AC | PRN

## 2020-10-13 ENCOUNTER — Encounter: Payer: Self-pay | Admitting: Surgical

## 2020-10-13 DIAGNOSIS — Z419 Encounter for procedure for purposes other than remedying health state, unspecified: Secondary | ICD-10-CM | POA: Diagnosis not present
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
## 2020-10-25 ENCOUNTER — Encounter: Payer: Self-pay | Admitting: Physical Medicine and Rehabilitation

## 2020-10-25 ENCOUNTER — Ambulatory Visit (INDEPENDENT_AMBULATORY_CARE_PROVIDER_SITE_OTHER): Payer: Medicaid Other | Admitting: Physical Medicine and Rehabilitation

## 2020-10-25 ENCOUNTER — Ambulatory Visit: Payer: Self-pay

## 2020-10-25 ENCOUNTER — Other Ambulatory Visit: Payer: Self-pay

## 2020-10-25 DIAGNOSIS — M25551 Pain in right hip: Secondary | ICD-10-CM | POA: Diagnosis not present

## 2020-10-25 NOTE — Patient Instructions (Signed)

## 2020-10-25 NOTE — Progress Notes (Signed)
   Gregory Gay - 55 y.o. male MRN [id number]  Date of birth: 1966-02-06  Office Visit Note: Visit Date: 10/25/2020 PCP: Claiborne Rigg, NP Referred by: Claiborne Rigg, NP  Subjective: Chief Complaint  Patient presents with  . Right Hip - Pain   HPI:  Gregory Gay is a 55 y.o. male who comes in today at the request of Karenann Cai, PA-C for planned Right anesthetic hip arthrogram with fluoroscopic guidance.  The patient has failed conservative care including home exercise, medications, time and activity modification.  This injection will be diagnostic and hopefully therapeutic.  Please see requesting physician notes for further details and justification.  ROS Otherwise per HPI.  Assessment & Plan: Visit Diagnoses:    ICD-10-CM   1. Pain in right hip  M25.551 Large Joint Inj: R hip joint    XR C-ARM NO REPORT    Plan: No additional findings.   Meds & Orders: No orders of the defined types were placed in this encounter.   Orders Placed This Encounter  Procedures  . Large Joint Inj: R hip joint  . XR C-ARM NO REPORT    Follow-up: No follow-ups on file.   Procedures: Large Joint Inj: R hip joint on 10/25/2020 1:57 PM Indications: diagnostic evaluation and pain Details: 22 G 3.5 in needle, fluoroscopy-guided anterior approach  Arthrogram: No  Medications: 4 mL bupivacaine 0.25 %; 60 mg triamcinolone acetonide 40 MG/ML Outcome: tolerated well, no immediate complications  There was excellent flow of contrast producing a partial arthrogram of the hip. The patient did have relief of symptoms during the anesthetic phase of the injection. Procedure, treatment alternatives, risks and benefits explained, specific risks discussed. Consent was given by the patient. Immediately prior to procedure a time out was called to verify the correct patient, procedure, equipment, support staff and site/side marked as required. Patient was prepped and draped in the usual sterile fashion.           Clinical History: No specialty comments available.     Objective:  VS:  HT:    WT:   BMI:     BP:   HR: bpm  TEMP: ( )  RESP:  Physical Exam   Imaging: No results found.

## 2020-11-08 ENCOUNTER — Ambulatory Visit: Payer: Medicaid Other | Admitting: Orthopedic Surgery

## 2020-11-13 DIAGNOSIS — Z419 Encounter for procedure for purposes other than remedying health state, unspecified: Secondary | ICD-10-CM | POA: Diagnosis not present

## 2020-11-26 ENCOUNTER — Ambulatory Visit (INDEPENDENT_AMBULATORY_CARE_PROVIDER_SITE_OTHER): Payer: Medicaid Other | Admitting: Orthopedic Surgery

## 2020-11-26 DIAGNOSIS — M1611 Unilateral primary osteoarthritis, right hip: Secondary | ICD-10-CM

## 2020-11-26 DIAGNOSIS — M25551 Pain in right hip: Secondary | ICD-10-CM | POA: Diagnosis not present

## 2020-11-26 MED ORDER — DICLOFENAC SODIUM 75 MG PO TBEC: DELAYED_RELEASE_TABLET | ORAL | 0 refills | Status: AC

## 2020-11-27 ENCOUNTER — Ambulatory Visit: Payer: Medicaid Other | Admitting: Nurse Practitioner

## 2020-12-01 ENCOUNTER — Encounter: Payer: Self-pay | Admitting: Orthopedic Surgery
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
## 2020-12-03 MED ORDER — TRIAMCINOLONE ACETONIDE 40 MG/ML IJ SUSP
60.0000 mg | INTRAMUSCULAR | Status: AC | PRN
  Administered 2020-10-25: 60 mg via INTRA_ARTICULAR

## 2020-12-03 MED ORDER — BUPIVACAINE HCL 0.25 % IJ SOLN
4.0000 mL | INTRAMUSCULAR | Status: AC | PRN
Start: 2020-10-25 — End: 2020-10-25
  Administered 2020-10-25: 4 mL via INTRA_ARTICULAR

## 2020-12-11 DIAGNOSIS — Z419 Encounter for procedure for purposes other than remedying health state, unspecified: Secondary | ICD-10-CM | POA: Diagnosis not present

## 2021-01-28 ENCOUNTER — Telehealth: Payer: Self-pay | Admitting: Nurse Practitioner

## 2021-01-28 NOTE — Telephone Encounter (Signed)
Patient dropped of [facility] form to be signed by Zelda. Please contact pt we ready to pick up

## 2021-01-29 ENCOUNTER — Ambulatory Visit: Payer: Medicaid Other | Attending: Nurse Practitioner | Admitting: Nurse Practitioner

## 2021-01-29 ENCOUNTER — Encounter: Payer: Self-pay | Admitting: Nurse Practitioner

## 2021-01-29 ENCOUNTER — Other Ambulatory Visit: Payer: Self-pay

## 2021-01-29 DIAGNOSIS — D72829 Elevated white blood cell count, unspecified: Secondary | ICD-10-CM | POA: Diagnosis not present

## 2021-01-29 DIAGNOSIS — Z1211 Encounter for screening for malignant neoplasm of colon: Secondary | ICD-10-CM

## 2021-01-29 DIAGNOSIS — Z1159 Encounter for screening for other viral diseases: Secondary | ICD-10-CM

## 2021-01-29 DIAGNOSIS — G4733 Obstructive sleep apnea (adult) (pediatric): Secondary | ICD-10-CM

## 2021-01-29 DIAGNOSIS — I10 Essential (primary) hypertension: Secondary | ICD-10-CM | POA: Diagnosis not present

## 2021-01-29 DIAGNOSIS — E782 Mixed hyperlipidemia: Secondary | ICD-10-CM | POA: Diagnosis not present

## 2021-01-29 MED ORDER — LOSARTAN POTASSIUM 50 MG PO TABS
50.0000 mg | ORAL_TABLET | Freq: Every day | ORAL | 1 refills | Status: AC
  Filled 2021-01-29: qty 30, 30d supply, fill #0

## 2021-01-29 MED ORDER — LOVASTATIN 20 MG PO TABS
20.0000 mg | ORAL_TABLET | Freq: Every morning | ORAL | 3 refills | Status: AC
  Filled 2021-01-29: qty 30, 30d supply, fill #0

## 2021-01-29 MED ORDER — HYDROCHLOROTHIAZIDE 25 MG PO TABS
25.0000 mg | ORAL_TABLET | Freq: Every day | ORAL | 1 refills | Status: AC
  Filled 2021-01-29: qty 30, 30d supply, fill #0
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
## 2021-01-29 NOTE — Telephone Encounter (Signed)
FYI  Form has been located and will be given to pcp and pt will be called once form is completed

## 2021-02-05 ENCOUNTER — Other Ambulatory Visit: Payer: Self-pay

## 2021-03-01 ENCOUNTER — Telehealth: Payer: Self-pay | Admitting: Nurse Practitioner

## 2021-03-01 NOTE — Telephone Encounter (Signed)
Kia from sleep center called to let PCP know that the request for sleep study was denied / please advise

## 2021-03-01 NOTE — Telephone Encounter (Signed)
Can we find out why it was denied? Thanks

## 2021-03-22 ENCOUNTER — Telehealth: Payer: Self-pay | Admitting: Nurse Practitioner

## 2021-03-22 NOTE — Telephone Encounter (Signed)
When will paperwork be ready

## 2021-03-22 NOTE — Telephone Encounter (Signed)
Pt is calling to check on the status of his paperwork that he dropped off from the plasma center. Was dropped off 2-3 months ago CB- [phone]

## 2021-03-22 NOTE — Telephone Encounter (Signed)
Needs to have them fax another form or he can drop another one off. That was faxed 2 months ago.

## 2021-03-26 ENCOUNTER — Telehealth: Payer: Self-pay | Admitting: Nurse Practitioner

## 2021-03-26 NOTE — Telephone Encounter (Signed)
Copied from CRM [id number]. Topic: Referral - Request for Referral >> Mar 26, 2021 12:43 PM Marylen Ponto wrote: Has patient seen PCP for this complaint? Yes.   *If NO, is insurance requiring patient see PCP for this issue before PCP can refer them? Referral for which specialty: Pt request referral for sleep study Preferred provider/office: no specific provider Reason for referral: Pt request referral for sleep study

## 2021-03-27 ENCOUNTER — Other Ambulatory Visit: Payer: Self-pay | Admitting: Nurse Practitioner

## 2021-03-27 DIAGNOSIS — G4733 Obstructive sleep apnea (adult) (pediatric): Secondary | ICD-10-CM

## 2021-03-27 NOTE — Telephone Encounter (Signed)
Orders placed.

## 2021-03-27 NOTE — Telephone Encounter (Signed)
Pt requesting Sleep study,  I will re submit the PA once orders are placed.

## 2021-04-12 ENCOUNTER — Ambulatory Visit: Payer: Medicaid Other | Admitting: Nurse Practitioner

## 2021-04-16 ENCOUNTER — Telehealth: Payer: Medicaid Other | Admitting: Nurse Practitioner

## 2021-04-16 ENCOUNTER — Ambulatory Visit: Payer: Medicaid Other | Admitting: Nurse Practitioner

## 2021-05-22 ENCOUNTER — Telehealth: Payer: Self-pay | Admitting: Nurse Practitioner

## 2021-05-22 NOTE — Telephone Encounter (Signed)
Copied from CRM [id number]. Topic: General - Other >> May 16, 2021  2:15 PM Jaquita Rector A wrote: Reason for CRM: Patient called in to request a call back from [facility] to discuss getting approved for a sleep study  Ph# [phone]

## 2021-05-22 NOTE — Telephone Encounter (Signed)
Called left patient a vm to call [phone] to get an appointment with provider to discuss sleep study.

## 2021-05-22 NOTE — Telephone Encounter (Signed)
Please schedule patient in 810 or 130 slot to discuss sleep study.

## 2021-05-24 ENCOUNTER — Ambulatory Visit: Payer: Medicaid Other | Admitting: Orthopedic Surgery

## 2021-06-04 ENCOUNTER — Ambulatory Visit: Payer: Medicaid Other | Admitting: Nurse Practitioner

## 2021-06-13 DEATH — deceased

## 2021-07-03 ENCOUNTER — Ambulatory Visit: Payer: Medicaid Other | Admitting: Nurse Practitioner

## 2023-02-16 ENCOUNTER — Telehealth: Payer: Self-pay

## 2023-02-16 NOTE — Telephone Encounter (Signed)
LVM for patient to call back. AS, CMA
